# Patient Record
Sex: Female | Born: 1995 | Smoking: Never smoker
Health system: Southern US, Community
[De-identification: ages and names within clinical notes are randomized; demographics above are authoritative.]

## PROBLEM LIST (undated history)

## (undated) DIAGNOSIS — J45909 Unspecified asthma, uncomplicated: Secondary | ICD-10-CM

---

## 2015-07-10 ENCOUNTER — Inpatient Hospital Stay
Admission: EM | Admit: 2015-07-10 | Discharge: 2015-07-16 | DRG: 885 | Disposition: A | Discharge status: Home / Self Care | Payer: No Typology Code available for payment source | Source: Other Acute Inpatient Hospital | Attending: Psychiatry | Admitting: Psychiatry

## 2015-07-10 DIAGNOSIS — G4700 Insomnia, unspecified: Secondary | ICD-10-CM | POA: Diagnosis present

## 2015-07-10 DIAGNOSIS — Z9119 Patient's noncompliance with other medical treatment and regimen: Secondary | ICD-10-CM

## 2015-07-10 DIAGNOSIS — F209 Schizophrenia, unspecified: Principal | ICD-10-CM | POA: Diagnosis present

## 2015-07-10 DIAGNOSIS — F29 Unspecified psychosis not due to a substance or known physiological condition: Secondary | ICD-10-CM

## 2015-07-10 DIAGNOSIS — R454 Irritability and anger: Secondary | ICD-10-CM | POA: Diagnosis present

## 2015-07-10 DIAGNOSIS — Z9114 Patient's other noncompliance with medication regimen: Secondary | ICD-10-CM

## 2015-07-10 DIAGNOSIS — F22 Delusional disorders: Secondary | ICD-10-CM | POA: Diagnosis present

## 2015-07-10 DIAGNOSIS — J45909 Unspecified asthma, uncomplicated: Secondary | ICD-10-CM | POA: Diagnosis present

## 2015-07-10 HISTORY — DX: Unspecified asthma, uncomplicated: J45.909

## 2015-07-10 MED ORDER — ACETAMINOPHEN 325 MG PO TABS
650.0000 mg | ORAL_TABLET | Freq: Four times a day (QID) | ORAL | Status: DC | PRN
  Administered 2015-07-15 – 2015-07-16 (×3): 650 mg via ORAL
  Filled 2015-07-10 (×3): qty 2

## 2015-07-10 MED ORDER — MAGNESIUM HYDROXIDE 400 MG/5ML PO SUSP: 30.0000 mL | Freq: Every day | ORAL | Status: DC | PRN

## 2015-07-10 MED ORDER — OLANZAPINE 10 MG PO TABS
10.0000 mg | ORAL_TABLET | Freq: Once | ORAL | Status: AC
  Administered 2015-07-10: 10 mg via ORAL
  Filled 2015-07-10: qty 1

## 2015-07-10 MED ORDER — ALUM & MAG HYDROXIDE-SIMETH 200-200-20 MG/5ML PO SUSP: 30.0000 mL | ORAL | Status: DC | PRN

## 2015-07-10 NOTE — Progress Notes (Signed)
Patient with bright affect and cooperative behavior with skin check. Denies SI/HI at this time. Skin check with no wounds or bruises noted. Skin check with no contraband found. Patient oriented to room and provided with toiletries to shower. Safety maintained.
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
## 2015-07-11 ENCOUNTER — Encounter: Payer: Self-pay | Admitting: Psychiatry

## 2015-07-11 DIAGNOSIS — F209 Schizophrenia, unspecified: Principal | ICD-10-CM

## 2015-07-11 LAB — URINALYSIS COMPLETE WITH MICROSCOPIC (ARMC ONLY)
Bilirubin Urine: NEGATIVE
Glucose, UA: NEGATIVE mg/dL
Hgb urine dipstick: NEGATIVE
Ketones, ur: NEGATIVE mg/dL
Leukocytes, UA: NEGATIVE
Nitrite: NEGATIVE
PH: 6 (ref 5.0–8.0)
PROTEIN: NEGATIVE mg/dL
Specific Gravity, Urine: 1.004 — ABNORMAL LOW (ref 1.005–1.030)
WBC UA: NONE SEEN WBC/hpf (ref 0–5)

## 2015-07-11 MED ORDER — FAMOTIDINE 20 MG PO TABS
20.0000 mg | ORAL_TABLET | Freq: Every day | ORAL | Status: DC
  Administered 2015-07-11 – 2015-07-16 (×4): 20 mg via ORAL
  Filled 2015-07-11 (×7): qty 1

## 2015-07-11 MED ORDER — LORAZEPAM 2 MG PO TABS: 2.0000 mg | ORAL_TABLET | Freq: Three times a day (TID) | ORAL | Status: DC | PRN

## 2015-07-11 MED ORDER — TRAZODONE HCL 100 MG PO TABS: 100.0000 mg | ORAL_TABLET | Freq: Every evening | ORAL | Status: DC | PRN

## 2015-07-11 MED ORDER — WHITE PETROLATUM GEL
Status: DC | PRN
  Administered 2015-07-15: 13:00:00 via TOPICAL
  Filled 2015-07-11 (×2): qty 5

## 2015-07-11 MED ORDER — OLANZAPINE 5 MG PO TBDP
15.0000 mg | ORAL_TABLET | Freq: Every day | ORAL | Status: DC
  Administered 2015-07-11 – 2015-07-15 (×3): 15 mg via ORAL
  Filled 2015-07-11 (×6): qty 3

## 2015-07-11 NOTE — [facility] Group Notes (Signed)
[facility] LCSW Group Therapy  07/11/2015 1:01 PM  Type of Therapy:  Group Therapy  Participation Level:  Did Not Attend  Modes of Intervention:  Discussion, Education, Socialization and Support  Summary of Progress/Problems: Balance in life: Patients will discuss the concept of balance and how it looks and feels to be unbalanced. Pt will identify areas in their life that is unbalanced and ways to become more balanced.    Katherine Daugherty L Marcele Kosta MSW, LCSWA  07/11/2015, 1:01 PM   

## 2015-07-11 NOTE — Plan of Care (Signed)
Problem: Ineffective individual coping Goal: LTG: Patient will report a decrease in negative feelings Outcome: Not Progressing No group attendance limited insight

## 2015-07-11 NOTE — Progress Notes (Signed)
Patient ID: Katherine Daugherty, female   DOB: 05-15-1996, 20 y.o.   MRN: [id number]   Admission Note:  D:19 yr female who presents IVC in no acute distress for the treatment of psychosis . Pt appears disconnected, laughs inappropriately . Pt was calm and cooperative with admission process. Pt denies SI/HI/AVH at this time. Pt appears to be responding ,   A:Skin was assessed by prior shift. Admission finished . 15 min checks for safety continues  R: Pt had no additional questions or concerns.

## 2015-07-11 NOTE — [facility] Suicide Risk Assessment (Signed)
[facility] Admission Suicide Risk Assessment   Nursing information obtained from:    Demographic factors:    Current Mental Status:    Loss Factors:    Historical Factors:    Risk Reduction Factors:     Total Time spent with patient: 1 hour Principal Problem: Schizophrenia (HCC) Diagnosis:   Patient Active Problem List   Diagnosis Date Noted  . Schizophrenia (HCC) [F20.9] 07/10/2015   Subjective Data:   Continued Clinical Symptoms:  Alcohol Use Disorder Identification Test Final Score (AUDIT): 0 The "Alcohol Use Disorders Identification Test", Guidelines for Use in Primary Care, Second Edition.  World Science writer [facility]). Score between 0-7:  no or low risk or alcohol related problems. Score between 8-15:  moderate risk of alcohol related problems. Score between 16-19:  high risk of alcohol related problems. Score 20 or above:  warrants further diagnostic evaluation for alcohol dependence and treatment.   CLINICAL FACTORS:   Schizophrenia:   Less than 55 years old Currently Psychotic Previous Psychiatric Diagnoses and Treatments   Psychiatric Specialty Exam: ROS   COGNITIVE FEATURES THAT CONTRIBUTE TO RISK:  None    SUICIDE RISK:   Mild:  Suicidal ideation of limited frequency, intensity, duration, and specificity.  There are no identifiable plans, no associated intent, mild dysphoria and related symptoms, good self-control (both objective and subjective assessment), few other risk factors, and identifiable protective factors, including available and accessible social support.  PLAN OF CARE: admit to [facility]  I certify that inpatient services furnished can reasonably be expected to improve the patient's condition.   Jimmy Footman, MD 07/11/2015, 2:46 PM

## 2015-07-11 NOTE — H&P (Addendum)
Psychiatric Admission Assessment Adult  Patient Identification: Katherine Daugherty MRN:  [id number] Date of Evaluation:  07/11/2015 Chief Complaint:  Schizophrenia Principal Diagnosis: Schizophrenia (HCC) Diagnosis:   Patient Active Problem List   Diagnosis Date Noted  . Schizophrenia (HCC) [F20.9] 07/10/2015   History of Present Illness: Katherine Daugherty is an 20 y.o. African American female who presents to [facility] on referral from [employer] at [place]. Patient's mother, [name] [phone]), petitioned for involuntary commitment due to patient's increasingly erratic behavior. Per mother's report, patient ran out of her prescription of Zyprexa approximately five days earlier and has begun exhibiting the following symptoms: sleeplessness, responding to internal stimuli (talking to herself, laughing continuously, talking to the wall), threatening to kill siblings, and threatening to kill herself. Per report, patient has a diagnosis of Schizophreniform Disorder, PTSD, and Cannabis Use Disorder (Mild).  Patient lives with her mother and sibling in [place], [place]. 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 Per mother's report, patient began receiving psychiatric services through [facility] about a month ago. Per report, [facility] prescribed Zyprexa (5 mg am, 10 mg pm). Mother stated that immediately prior to commitment, patient refused to attend a scheduled appointment with [facility]. Mother contacted [facility] and was advised that patient should have had enough medication if taken as prescribed.  Patient is an unreliable historian. She was only partially cooperative during assessment.  Patient initially denied being diagnosed with schizophrenia and denied being on any medications prior to admission. 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 She says she just came to [place] a few days ago to visit her mother.  Later on the patient stated that her mother put her in the  hospital because she has not been taking medications. The patient says that Dr. at [facility] is started her on Risperdal at this medication did not work she was then transitioned to Zyprexa.  Patient says she is unaware of the reasons why she was prescribed these medications. Today she denies problems with her mood, appetite energy or concentration. She denies suicidality, homicidality or having auditory or visual hallucinations. She denies having any persecutory delusions.  Social worker contacted the patient's mother for collateral information. The patient's mother reported patient is not enrolled in college at this time. Patient was diagnosed with schizophrenia couple months ago and has not been compliant with medications. Patient lives with her mother.  Substance abuse history: Patient denies the use of illicit substances or alcohol. She denies the use of nicotine   Associated Signs/Symptoms: Depression Symptoms:  denies (Hypo) Manic Symptoms:  denies Anxiety Symptoms:  denies Psychotic Symptoms:  denies PTSD Symptoms: NA   Total Time spent with patient: 1 hour  Past Psychiatric History: At this time I don't have much of her past psychiatric history. Looks for the patient was diagnosed recently with schizophrenia and has been follow-up at [facility] however she has become noncompliant lately.  Past Medical History: Denies any history of chronic medical conditions. Seizures or head trauma Past Medical History  Diagnosis Date  . Asthma    History reviewed. No pertinent past surgical history.  Family History: History reviewed. No pertinent family history.  Family Psychiatric  History: Patient denies any family history of mental illness, suicide or substance abuse  Social History: Not much of her social history is known at this time. She lives with her mother. Patient says she is uninsured. History  Alcohol Use: Not on file     History  Drug Use No  Allergies:  Not on File   Lab  Results: No results found for this or any previous visit (from the past 48 hour(s)).  Metabolic Disorder Labs:  No results found for: HGBA1C, MPG No results found for: PROLACTIN No results found for: CHOL, TRIG, HDL, CHOLHDL, VLDL, LDLCALC  Current Medications: Current Facility-Administered Medications  Medication Dose Route Frequency Provider Last Rate Last Dose  . acetaminophen (TYLENOL) tablet 650 mg  650 mg Oral Q6H PRN Audery Amel, MD      . alum & mag hydroxide-simeth (MAALOX/MYLANTA) 200-200-20 MG/5ML suspension 30 mL  30 mL Oral Q4H PRN Audery Amel, MD      . magnesium hydroxide (MILK OF MAGNESIA) suspension 30 mL  30 mL Oral Daily PRN Audery Amel, MD       PTA Medications: No prescriptions prior to admission    Musculoskeletal: Strength & Muscle Tone: within normal limits Gait & Station: normal Patient leans: N/A  Psychiatric Specialty Exam: Physical Exam  Constitutional: She is oriented to person, place, and time. She appears well-developed and well-nourished.  HENT:  Head: Normocephalic and atraumatic.  Eyes: Conjunctivae and EOM are normal.  Neck: Normal range of motion.  Respiratory: Effort normal.  Musculoskeletal: Normal range of motion.  Neurological: She is alert and oriented to person, place, and time.    Review of Systems  Constitutional: Negative.   HENT: Negative.   Eyes: Negative.   Respiratory: Negative.   Cardiovascular: Negative.   Gastrointestinal: Negative.   Genitourinary: Negative.   Musculoskeletal: Negative.   Skin: Negative.   Neurological: Negative.   Endo/Heme/Allergies: Negative.   Psychiatric/Behavioral: Negative.     Blood pressure 130/84, pulse 93, temperature 98.1 F (36.7 C), temperature source Oral, resp. rate 18, height 5' 9.5" (1.765 m), weight 82.555 kg (182 lb), last menstrual period 06/10/2015.Body mass index is 26.5 kg/(m^2).  General Appearance: Fairly Groomed  Patent attorney::  Good  Speech:  Clear and Coherent   Volume:  Normal  Mood:  Dysphoric  Affect:  Constricted  Thought Process:  Linear  Orientation:  Full (Time, Place, and Person)  Thought Content:  Negative for auditory or visual hallucinations but positive for delusions  Suicidal Thoughts:  No  Homicidal Thoughts:  No  Memory:  Immediate;   Good Recent;   Good Remote;   Good  Judgement:  Impaired  Insight:  Lacking  Psychomotor Activity:  Decreased  Concentration:  Good  Recall:  Fair  Fund of Knowledge:Good  Language: Good  Akathisia:  No  Handed:    AIMS (if indicated):     Assets:  Manufacturing systems engineer Physical Health  ADL's:  Intact  Cognition: WNL  Sleep:  Number of Hours: 5.45    Treatment Plan Summary: Daily contact with patient to assess and evaluate symptoms and progress in treatment and Medication management   20 year old African-American female with diagnosis of schizophrenia. Patient has been admitted due to bizarre behavior secondary to noncompliance with antipsychotics.  Schizophrenia: Patient will be restarted on olanzapine 15 mg by mouth daily.  They do not plan will be to discharge this patient on a injectable medication due to her lack of insight and poor compliance.  Insomnia and she'll be started on trazodone 100 mg by mouth by mouth daily at bedtime when necessary  Agitation: I will order Ativan 2 mg by mouth every 8 hours as needed when necessary for agitation  Diet regular  Precocious every 15 minute checks  Hospitalization status continue involuntary commitment  Collateral  information will be obtained from the patient's mother  Labs: We'll order hemoglobin A1c, lipid panel, TSH and prolactin.  I certify that inpatient services furnished can reasonably be expected to improve the patient's condition.    Jimmy Footman, MD 2/2/20172:01 PM

## 2015-07-11 NOTE — Progress Notes (Signed)
D: Patient remains isolated to room  D: Patient stated slept good last night .Stated appetite is good and energy level  Is normal. Stated concentration is good . Stated on Depression scale , hopeless and anxiety .( low 0-10 high) Denies suicidal  homicidal ideations  .  No auditory hallucinations  No pain concerns . Appropriate ADL'S. Interacting with peers and staff.  A: Encourage patient participation with unit programming . Instruction  Given on  Medication , verbalize understanding. R: Voice no other concerns. Staff continue to monitor

## 2015-07-11 NOTE — Progress Notes (Signed)
Recreation Therapy Notes  Date: 02.02.17 Time: 3:00 pm Location: Craft Room  Group Topic: Leisure Education  Goal Area(s) Addresses:  Patient will identify things they are grateful for. Patient will identify how being grateful can influence decision making.  Behavioral Response: Did not attend  Intervention: Grateful Wheel  Activity: Patients were given an "I Am Grateful For" worksheet and instructed to list 2-3 things they are grateful for under each category.  Education: LRT educated patients on ways they can put leisure back into their schedule.  Education Outcome: Patient did not attend group.  Clinical Observations/Feedback: Patient did not attend group.  Jacquelynn Cree, LRT/CTRS 07/11/2015 3:45 PM

## 2015-07-11 NOTE — Tx Team (Signed)
Initial Interdisciplinary Treatment Plan   PATIENT STRESSORS: Medication change or noncompliance   PATIENT STRENGTHS: General fund of knowledge Supportive family/friends   PROBLEM LIST: Problem List/Patient Goals Date to be addressed Date deferred Reason deferred Estimated date of resolution  Psychosis      "ha ha ha, nothing"                                                 DISCHARGE CRITERIA:  Improved stabilization in mood, thinking, and/or behavior Verbal commitment to aftercare and medication compliance  PRELIMINARY DISCHARGE PLAN: Attend aftercare/continuing care group Outpatient therapy  PATIENT/FAMIILY INVOLVEMENT: This treatment plan has been presented to and reviewed with the patient, Katherine Daugherty.  The patient and family have been given the opportunity to ask questions and make suggestions.  Jacques Navy A 07/11/2015, 2:04 AM

## 2015-07-11 NOTE — Progress Notes (Signed)
Recreation Therapy Notes  INPATIENT RECREATION THERAPY ASSESSMENT  Patient Details Name: Katherine Daugherty MRN: [id number] DOB: [birth year] Today's Date: 07/11/2015  Patient Stressors:  Patient reported no stressors.  Coping Skills:   Isolate, Exercise, Art/Dance, Music, Sports, Other (Comment) (Sleep)  Personal Challenges:  Patient reported no personal challenges.  Leisure Interests (2+):  Music - Listen, Individual - Other (Comment) (Read)  Awareness of Community Resources:  Yes  Community Resources:  Library, Tree surgeon  Current Use: Yes  If no, Barriers?:    Patient Strengths:  Hair, skin  Patient Identified Areas of Improvement:  No  Current Recreation Participation:  NOthing but school work  Patient Goal for Hospitalization:  To hurry up and get out  [place] of Residence:  [place] of Residence:  [place]   Current [place] (including self-harm):  No  Current HI:  No  Consent to Intern Participation: N/A  Due to patient reporting no personal challenges, LRT will not develop a Recreational Therapy Care Plan at this time. If patient's status changes, LRT will develop a Recreational Therapy Care Plan.  Jacquelynn Cree, LRT/CTRS 07/11/2015, 4:47 PM

## 2015-07-11 NOTE — [facility] Group Notes (Signed)
[facility] Group Notes:  (Nursing/MHT/Case Management/Adjunct)  Date:  07/11/2015  Time:  12:52 AM  Type of Therapy:  Group Therapy  Participation Level:  Did Not Attend   Summary of Progress/Problems:  Veva Holes 07/11/2015, 12:52 AM

## 2015-07-12 ENCOUNTER — Inpatient Hospital Stay: Payer: No Typology Code available for payment source

## 2015-07-12 NOTE — [facility] Group Notes (Signed)
[facility] LCSW Group Therapy  07/12/2015 2:47 PM  Type of Therapy:  Group Therapy  Participation Level:  Did Not Attend  Modes of Intervention:  Discussion, Education, Socialization and Support  Summary of Progress/Problems: Feelings around Relapse. Group members discussed the meaning of relapse and shared personal stories of relapse, how it affected them and others, and how they perceived themselves during this time. Group members were encouraged to identify triggers, warning signs and coping skills used when facing the possibility of relapse. Social supports were discussed and explored in detail.   Katherine Daugherty L Rogue Pautler  MSW, LCSWA   07/12/2015, 2:47 PM  

## 2015-07-12 NOTE — Plan of Care (Signed)
Problem: Alteration in thought process Goal: LTG-Patient has not harmed self or others in at least 2 days Outcome: Progressing Denies suicidal ideations      

## 2015-07-12 NOTE — [facility] Group Notes (Signed)
[facility] LCSW Aftercare Discharge Planning Group Note   07/12/2015 11:06 AM  Patient did not attend.  Jenel Lucks, CSW Intern 07/12/15

## 2015-07-12 NOTE — [facility] Group Notes (Signed)
[facility] Group Notes:  (Nursing/MHT/Case Management/Adjunct)  Date:  07/12/2015  Time:  2:49 PM  Type of Therapy:  Group Therapy  Participation Level:  Did Not Attend  Katherine Daugherty 07/12/2015, 2:49 PM 

## 2015-07-12 NOTE — Tx Team (Signed)
Interdisciplinary Treatment Plan Update (Adult)  Date:  07/12/2015 Time Reviewed:  5:02 PM  Progress in Treatment: Attending groups: No. Participating in groups:  No. Taking medication as prescribed:  Yes. Tolerating medication:  Yes. Family/Significant othe contact made:  Yes, individual(s) contacted:  mother Patient understands diagnosis:  No. and As evidenced by:  limited insight  Discussing patient identified problems/goals with staff:  Yes. Medical problems stabilized or resolved:  Yes. Denies suicidal/homicidal ideation: Yes. Issues/concerns per patient self-inventory:  Yes. Other:  New problem(s) identified: No, Describe:  NA  Discharge Plan or Barriers: Pt plans to return home and follow up with outpatient.    Reason for Continuation of Hospitalization: Delusions  Hallucinations Medication stabilization  Comments: Katherine Daugherty is an 20 y.o. African American female who presents to [place] [facility] on referral from [facility] at [place]. Patient's mother, Katherine Daugherty [id number]), petitioned for involuntary commitment due to patient's increasingly erratic behavior. Per mother's report, patient ran out of her prescription of Zyprexa approximately five days earlier and has begun exhibiting the following symptoms: sleeplessness, responding to internal stimuli (talking to herself, laughing continuously, talking to the wall), threatening to kill siblings, and threatening to kill herself. Per report, patient has a diagnosis of Schizophreniform Disorder, PTSD, and Cannabis Use Disorder (Mild).  Patient lives with her mother and sibling in [place]. 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 Per mother's report, patient began receiving psychiatric services through [facility] about a month ago. Per report, [facility] prescribed Zyprexa (5 mg am, 10 mg pm). Mother stated that immediately prior to commitment, patient refused to attend a scheduled appointment with  [facility]. Mother contacted [facility] and was advised that patient should have had enough medication if taken as prescribed.   Estimated length of stay:5-7 days   New goal(s): NA  Review of initial/current patient goals per problem list:   1.  Goal(s): Patient will participate in aftercare plan * Met:  * Target date: at discharge * As evidenced by: Patient will participate within aftercare plan AEB aftercare provider and housing plan at discharge being identified.  2.  Goal (s): Patient will demonstrate decreased symptoms of psychosis. * Met: No  *  Target date: at discharge * As evidenced by: Patient will not endorse signs of psychosis or be deemed stable for discharge by MD.   Attendees: Patient:  Katherine Daugherty 2/3/20175:02 PM  Family:   2/3/20175:02 PM  Physician:  Dr. Jerilee Hoh   2/3/20175:02 PM  Nursing:   Seth Bake, RN  2/3/20175:02 PM  Case Manager:   2/3/20175:02 PM  Counselor:   2/3/20175:02 PM  Other:  Campbell Stall Karle Desrosier,LCSWA 2/3/20175:02 PM  Other:  Everitt Amber, [place]  2/3/20175:02 PM  Other:   2/3/20175:02 PM  Other:  2/3/20175:02 PM  Other:  2/3/20175:02 PM  Other:  2/3/20175:02 PM  Other:  2/3/20175:02 PM  Other:  2/3/20175:02 PM  Other:  2/3/20175:02 PM  Other:   2/3/20175:02 PM   Scribe for Treatment Team:   Wray Kearns, MSW, LCSWA  07/12/2015, 5:02 PM

## 2015-07-12 NOTE — Progress Notes (Addendum)
Patient denies depression & suicidal ideations.Visible in the milieu,minimal interactions with peers.Compliant with medications.Denies AV hallucinations.Attended groups.Appetite & energy level normal.Patient refused to get urine for lab.

## 2015-07-12 NOTE — Plan of Care (Signed)
Problem: Alteration in thought process Goal: LTG-Patient verbalizes understanding importance med regimen (Patient verbalizes understanding of importance of medication regimen and need to continue outpatient care.)  Outcome: Progressing Pt complies with medication regimen

## 2015-07-12 NOTE — Progress Notes (Signed)
D: Observed pt in day room interacting with family during visiting hour. Patient alert and oriented x4. Patient denies SI/HI/AVH. Pt affect is anxious and irritable. Pt preoccupied with discharge stating "I don't think I should be here.Marland KitchenMarland KitchenI don't like being around people. " Pt forwards little and has no insight into hospitalization indicating she came here "because I ran out of meds,"  A: Offered active listening and support. Provided therapeutic communication. Administered scheduled medications.  R: Pt cooperative. Pt medication compliant. Will continue Q15 min. checks. Safety maintained.

## 2015-07-12 NOTE — Progress Notes (Signed)
Recreation Therapy Notes  Date: 02.03.17 Time: 3:00 pm Location: Craft Room  Group Topic: Communication, Problem Solving, Teamwork  Goal Area(s) Addresses:  Patient will work in teams towards shared goal. Patient will verbalize skills needed to make activities successful. Patient will verbalize benefit of using skills identify to reach post d/c goals.  Behavioral Response: Attentive, Interactive  Intervention: Landing Pad  Activity: Patients were given 15 straws and about 2.5-3 feet of tape and instructed to build a contraption to catch a golf ball dropped from about 4 feet.  Education: LRT educated patients on why these skills are important.  Education Outcome: Acknowledges education/In group clarification offered  Clinical Observations/Feedback: Patient did not work with peers to build contraption. Patient contributed to group discussion. Patient started laughing. LRT redirected patient. Patient complied.  Jacquelynn Cree, LRT/CTRS 07/12/2015 4:45 PM

## 2015-07-12 NOTE — Progress Notes (Addendum)
[facility] MD Progress Note  07/12/2015 10:10 AM Katherine Daugherty  MRN:  [id number] Subjective:  Patient continues to be vague and evasive basically answering questions with "yes" or "no".  Unreliable historian. We will unable to contact mother yesterday. Will attend again today to obtain collateral information. Patient denies depressed mood, problems with sleep, appetite energy or concentration. She denies auditory or visual hallucinations. She denies side effects from medications. She denies any physical complaints. 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 Patient comply with medications last night. Per nursing she is sleeping about 6 hours and need two meals.  Per nursing: D: Observed pt in day room interacting with family during visiting hour. Patient alert and oriented x4. Patient denies SI/HI/AVH. Pt affect is anxious and irritable. Pt preoccupied with discharge stating "I don't think I should be here.Marland KitchenMarland KitchenI don't like being around people. " Pt forwards little and has no insight into hospitalization indicating she came here "because I ran out of meds,"  A: Offered active listening and support. Provided therapeutic communication. Administered scheduled medications.  R: Pt cooperative. Pt medication compliant. Will continue Q15 min. checks. Safety maintained.  Principal Problem: Schizophrenia (HCC) Diagnosis:   Patient Active Problem List   Diagnosis Date Noted  . Schizophrenia (HCC) [F20.9] 07/10/2015   Total Time spent with patient: 30 minutes   Sleep: Good  Appetite:  Good   Past Psychiatric History: At this time I don't have much of her past psychiatric history. Looks for the patient was diagnosed recently with schizophrenia and has been follow-up at [facility] however she has become noncompliant lately.  Past Medical History: Denies any history of chronic medical conditions. Seizures or head trauma Past Medical History  Diagnosis Date  . Asthma    History reviewed. No pertinent past surgical history.  Family History: History reviewed. No pertinent family history.  Family Psychiatric History: Patient denies any family history of mental illness, suicide or substance abuse  Social History: Not much of her social history is known at this time. She lives with her mother. Patient says she is uninsured. History  Alcohol Use: Not on file    History  Drug Use No     Allergies: Not on File        Current Medications: Current Facility-Administered Medications  Medication Dose Route Frequency Provider Last Rate Last Dose  . acetaminophen (TYLENOL) tablet 650 mg  650 mg Oral Q6H PRN Audery Amel, MD      . alum & mag hydroxide-simeth (MAALOX/MYLANTA) 200-200-20 MG/5ML suspension 30 mL  30 mL Oral Q4H PRN Audery Amel, MD      . famotidine (PEPCID) tablet 20 mg  20 mg Oral Daily Jimmy Footman, MD   20 mg at 07/11/15 1616  . LORazepam (ATIVAN) tablet 2 mg  2 mg Oral TID PRN Jimmy Footman, MD      . magnesium hydroxide (MILK OF MAGNESIA) suspension 30 mL  30 mL Oral Daily PRN Audery Amel, MD      . OLANZapine zydis (ZYPREXA) disintegrating tablet 15 mg  15 mg Oral QHS Jimmy Footman, MD   15 mg at 07/11/15 2155  . traZODone (DESYREL) tablet 100 mg  100 mg Oral QHS PRN Jimmy Footman, MD      . white petrolatum (VASELINE) gel   Topical PRN Audery Amel, MD        Lab Results:  Results for orders placed or performed during the hospital encounter of 07/10/15 (  from the past 48 hour(s))  Urinalysis complete, with microscopic ([facility] only)     Status: Abnormal   Collection Time: 07/11/15  6:22 PM  Result Value Ref Range   Color, Urine STRAW (A) YELLOW   APPearance CLEAR (A) CLEAR   Glucose, UA NEGATIVE NEGATIVE mg/dL   Bilirubin Urine NEGATIVE NEGATIVE   Ketones, ur NEGATIVE NEGATIVE mg/dL   Specific Gravity, Urine 1.004 (L) 1.005 - 1.030   Hgb urine dipstick NEGATIVE NEGATIVE    pH 6.0 5.0 - 8.0   Protein, ur NEGATIVE NEGATIVE mg/dL   Nitrite NEGATIVE NEGATIVE   Leukocytes, UA NEGATIVE NEGATIVE   RBC / HPF 0-5 0 - 5 RBC/hpf   WBC, UA NONE SEEN 0 - 5 WBC/hpf   Bacteria, UA RARE (A) NONE SEEN   Squamous Epithelial / LPF 0-5 (A) NONE SEEN    Physical Findings: AIMS: Facial and Oral Movements Muscles of Facial Expression: None, normal Lips and Perioral Area: None, normal Jaw: None, normal Tongue: None, normal,Extremity Movements Upper (arms, wrists, hands, fingers): None, normal Lower (legs, knees, ankles, toes): None, normal, Trunk Movements Neck, shoulders, hips: None, normal, Overall Severity Severity of abnormal movements (highest score from questions above): None, normal Incapacitation due to abnormal movements: None, normal Patient's awareness of abnormal movements (rate only patient's report): No Awareness, Dental Status Current problems with teeth and/or dentures?: No Does patient usually wear dentures?: No  CIWA:    COWS:     Musculoskeletal: Strength & Muscle Tone: within normal limits Gait & Station: normal Patient leans: N/A  Psychiatric Specialty Exam: Review of Systems  Constitutional: Negative.   HENT: Negative.   Eyes: Negative.   Respiratory: Negative.   Cardiovascular: Negative.   Gastrointestinal: Negative.   Genitourinary: Negative.   Musculoskeletal: Negative.   Skin: Negative.   Neurological: Negative.   Endo/Heme/Allergies: Negative.   Psychiatric/Behavioral: Negative.     Blood pressure 113/63, pulse 82, temperature 98.1 F (36.7 C), temperature source Oral, resp. rate 18, height 5' 9.5" (1.765 m), weight 82.555 kg (182 lb), last menstrual period 06/10/2015.Body mass index is 26.5 kg/(m^2).  General Appearance: Fairly Groomed  Patent attorney::  Minimal  Speech:  Clear and Coherent  Volume:  Normal  Mood:  Euthymic  Affect:  Constricted  Thought Process:  vague  Orientation:  Full (Time, Place, and Person)  Thought  Content:  Delusions  Suicidal Thoughts:  No  Homicidal Thoughts:  No  Memory:  Immediate;   Fair Recent;   Fair Remote;   Fair  Judgement:  Impaired  Insight:  Lacking  Psychomotor Activity:  Decreased  Concentration:  Fair  Recall:  [employer] of Knowledge:Fair  Language: Fair  Akathisia:  No  Handed:    AIMS (if indicated):     Assets:  Games developer Social Support  ADL's:  Intact  Cognition: WNL  Sleep:  Number of Hours: 6.5   Treatment Plan Summary: Daily contact with patient to assess and evaluate symptoms and progress in treatment and Medication management  20 year old African-American female with diagnosis of schizophrenia. Patient has been admitted due to bizarre behavior secondary to noncompliance with antipsychotics.  Schizophrenia: Continue olanzapine 15 mg by mouth daily. Patient is states that she was taking olanzapine 15 mg prior to admission.  She had stopped taking them Zyprexa because she ran out.  Pan will be to discharge this patient on a injectable medication due to her lack of insight and poor compliance.  Insomnia: Continue trazodone 100 mg  by mouth daily at bedtime when necessary  Agitation: Continue Ativan 2 mg by mouth every 8 hours as needed when necessary for agitation  Diet regular  Precautions every 15 minute checks  Hospitalization status continue involuntary commitment  Collateral information will be obtained from the patient's mother  Labs: We'll order hemoglobin A1c, lipid panel, TSH and prolactin.--- These labs are still pending  The information was obtained from the patient's mother today. She reports that the patient was at [facility] in November for 3 weeks then she was restarted on olanzapine. She was then transferred to [facility] for one week. She was treated there with Risperdal 1 mg by mouth twice a day and however this medication was discontinued due to tremors and  she was treated again with olanzapine to 15 mg a day.  After discharge the patient became noncompliant. The patient refused to go to her follow-up appointment at [facility].  Patient has been using cannabis and only drinks occasionally on the weekends.  Mother said that prior to admission the patient was laughing inappropriately and throwing herself on the ground, she was arguing on the phone with videos.  Mother states that the patient has threatened suicide 3 times before and has attempted to strangle her.    Patient graduated high school.  Currently lives with her mother in [place]. Patient's father lives in [place].  Patient has not been in [place] in 7 years.  As far as family history patient has a great aunt who had suffered from schizophrenia.   Patient has been refusing blood work. We'll request again to have blood drawn today.  Jimmy Footman, MD 07/12/2015, 10:10 AM

## 2015-07-13 LAB — URINE DRUG SCREEN, QUALITATIVE (ARMC ONLY)
AMPHETAMINES, UR SCREEN: NOT DETECTED
BARBITURATES, UR SCREEN: NOT DETECTED
Benzodiazepine, Ur Scrn: NOT DETECTED
COCAINE METABOLITE, UR ~~LOC~~: NOT DETECTED
Cannabinoid 50 Ng, Ur ~~LOC~~: NOT DETECTED
MDMA (ECSTASY) UR SCREEN: NOT DETECTED
Methadone Scn, Ur: NOT DETECTED
OPIATE, UR SCREEN: NOT DETECTED
Phencyclidine (PCP) Ur S: NOT DETECTED
TRICYCLIC, UR SCREEN: NOT DETECTED

## 2015-07-13 LAB — PREGNANCY, URINE: Preg Test, Ur: NEGATIVE

## 2015-07-13 NOTE — [facility] Group Notes (Signed)
[facility] LCSW Group Therapy  07/13/2015 3:25 PM  Type of Therapy:  Group Therapy  Participation Level:  Did Not Attend  Modes of Intervention:  Discussion, Education, Socialization and Support  Summary of Progress/Problems: Boundaries: Patients defined boundaries and discussed the importance of them. Patients identified their own boundaries and how they feel when they are crossed. Patients discussed ways to create and/ or improve their personal boundaries.   Katherine Daugherty L Jarod Bozzo MSW, LCSWA  07/13/2015, 3:25 PM  
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
## 2015-07-13 NOTE — Plan of Care (Signed)
Problem: Alteration in thought process Goal: LTG-Patient verbalizes understanding importance med regimen (Patient verbalizes understanding of importance of medication regimen and need to continue outpatient care.)  Outcome: Progressing Pt complying with medication regimen.

## 2015-07-13 NOTE — [facility] Counselor (Signed)
Adult Comprehensive Assessment  Patient ID: Katherine Daugherty, female   DOB: 09-17-1995, 20 y.o.   MRN: [id number]  Information Source: Information source: Patient  Current Stressors:  Educational / Learning stressors: None reported  Employment / Job issues: Unemployed  Family Relationships: None reported  Surveyor, quantity / Lack of resources (include bankruptcy): Financial support from mother.  Housing / Lack of housing: Pt lives with mother.  Physical health (include injuries & life threatening diseases): None reported  Social relationships: None reported  Substance abuse: Denies use  Bereavement / Loss: Denies reported   Living/Environment/Situation:  Living Arrangements: Parent Living conditions (as described by patient or guardian): Pt lives with mother  How long has patient lived in current situation?: "a little while"  What is atmosphere in current home: Comfortable  Family History:  Marital status: Single Are you sexually active?: Yes What is your sexual orientation?: Heterosexual  Has your sexual activity been affected by drugs, alcohol, medication, or emotional stress?: None reported  Does patient have children?: No  Childhood History:  By whom was/is the patient raised?: Mother Additional childhood history information: Father lives in [place]  Description of patient's relationship with caregiver when they were a child: "ok" relationship with mother.  Patient's description of current relationship with people who raised him/her: "ok" relationship with mother.  How were you disciplined when you got in trouble as a child/adolescent?: None reported  Does patient have siblings?: Yes Number of Siblings: 6 Description of patient's current relationship with siblings: distant relationship  Did patient suffer any verbal/emotional/physical/sexual abuse as a child?: No Did patient suffer from severe childhood neglect?: No Has patient ever been sexually abused/assaulted/raped as an  adolescent or adult?: No Was the patient ever a victim of a crime or a disaster?: No Has patient been effected by domestic violence as an adult?: No  Education:  Highest grade of school patient has completed: high school  Currently a student?: No Name of school: n/a Contact person: n/a Learning disability?: No  Employment/Work Situation:   Employment situation: Unemployed Patient's job has been impacted by current illness: No What is the longest time patient has a held a job?: NA Where was the patient employed at that time?: NA Has patient ever been in the [employer]?: No  Financial Resources:   Surveyor, quantity resources: Support from parents / caregiver Does patient have a Lawyer or guardian?: No  Alcohol/Substance Abuse:   What has been your use of drugs/alcohol within the last 12 months?: Marijuana use Alcohol/Substance Abuse Treatment Hx: Denies past history Has alcohol/substance abuse ever caused legal problems?: No  Social Support System:   Conservation officer, nature Support System: Good Describe Community Support System: family  Type of faith/religion: NA How does patient's faith help to cope with current illness?: NA  Leisure/Recreation:   Leisure and Hobbies: basketball, football.  Strengths/Needs:   What things does the patient do well?: basketball and football In what areas does patient struggle / problems for patient: "nothing"   Discharge Plan:   Does patient have access to transportation?: Yes Will patient be returning to same living situation after discharge?: Yes Currently receiving community mental health services: Yes (From Whom) V[facility] Mixer in [place]) Does patient have financial barriers related to discharge medications?: Yes Patient description of barriers related to discharge medications: no income, no insurance   Summary/Recommendations:    Patient is a 19 year old female admitted to with a diagnosis of Schizophrenia. Patient presented to the  hospital with audio hallucinations,SI and HI. Patient reports primary  triggers for admission were non compliance on medication. Patient will benefit from crisis stabilization, medication evaluation, group therapy and psycho education in addition to case management for discharge. At discharge, it is recommended that patient remain compliant with established discharge plan and continued treatment.   Marqueze Ramcharan L Gillie Fleites. MSW, LCSWA  07/13/2015

## 2015-07-13 NOTE — Progress Notes (Signed)
D: Observed pt in day room interacting with peers. Patient alert and oriented x4. Patient denies SI/HI/AVH. Pt affect is anxious and irritable. Pt stated "the days been great" smiling excessively. Pt forwards little and has no insight into hospitalization. Pt laughs at inappropriate times.  A: Offered active listening and support. Provided therapeutic communication. Administered scheduled medications.  R: Pt cooperative. Pt medication compliant. Will continue Q15 min. checks. Safety maintained.

## 2015-07-13 NOTE — Progress Notes (Signed)
Pt has been pleasant and cooperative.Pt did attend any unit activities but has been mostly seclusive to her room. Pt's mood and affect has been depressed. Pt denies SI and A/V hallucinations. Pt refused her AM meds,stating, " I don't need that".

## 2015-07-14 MED ORDER — OLANZAPINE 10 MG IM SOLR: 15.0000 mg | Freq: Every evening | INTRAMUSCULAR | Status: DC | PRN

## 2015-07-14 NOTE — Plan of Care (Signed)
Problem: Ineffective individual coping Goal: STG: Patient will remain free from self harm Outcome: Progressing Patient denies any thoughts of self harm.     

## 2015-07-14 NOTE — [facility] Group Notes (Signed)
[facility] LCSW Group Therapy  07/14/2015 2:51 PM  Type of Therapy:  Group Therapy  Participation Level:  Minimal  Participation Quality:  Attentive  Affect:  Flat  Cognitive:  Alert  Insight:  Improving  Engagement in Therapy:  Improving  Modes of Intervention:  Activity, Discussion, Education, Socialization and Support  Summary of Progress/Problems:Feelings around diagnosis: Patients discussed what mental health means to them and how it has impacted their lives. Patients discussed receiving a diagnosis and how it made them feel. They were encouraged to share experiences related to mental health diagnosis and how other people have reacted. Patient attended group and stayed the entire time. She sat quietly and listened to other group members.   Katherine Daugherty L [name]  MSW, LCSWA   07/14/2015, 2:51 PM
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
## 2015-07-14 NOTE — [facility] Group Notes (Signed)
[facility] Group Notes:  (Nursing/MHT/Case Management/Adjunct)  Date:  07/14/2015  Time:  8:30 AM  Type of Therapy:  Community Meeting   Participation Level:  Minimal  Participation Quality:  Attentive  Affect:  Flat  Cognitive:  Alert  Insight:  Improving  Engagement in Group:  Limited  Modes of Intervention:  Discussion and Education  Summary of Progress/Problems:  Katherine Daugherty Katherine Daugherty 07/14/2015, 6:07 PM

## 2015-07-14 NOTE — Progress Notes (Signed)
Patient refused her Zyprexa on shift even when given encouragement to take it, she also denied being [place], she sat in the dayroom with peers most of the evening but required redirection in her interaction with others.  She appears to be in bed resting at this time.

## 2015-07-14 NOTE — Progress Notes (Signed)
[facility] MD Progress Note  07/14/2015 2:24 PM Katherine Daugherty  MRN:  [id number]   Subjective:    The patient was irritable yesterday evening and refused to take Zyprexa. She got into a verbal altercation with another patient yesterday as well but no is goal aggression. She has been noted to be laughing inappropriately at times although she denies any auditory hallucinations. She denies any visual hallucinations. Insight and judgment remain poor. She continues to also refused blood work. The patient does report some delusional beliefs about studying law in [place]. She is focused on discharge and has not been attending groups. She denies any problems with her appetite. Per nursing, she slept close to 6 hours last night. The patient admits that she has been reclusive to her room and sleeping some during the daytime as well.  Supportive psychotherapy provided and time spent discussing anger management and coping skills to help with anger.    Principal Problem: Schizophrenia (HCC) Diagnosis:   Patient Active Problem List   Diagnosis Date Noted  . Schizophrenia (HCC) [F20.9] 07/10/2015   Total Time spent with patient: 30 minutes   Sleep: Good  Appetite:  Good   Past Psychiatric History: At this time I don't have much of her past psychiatric history. Looks for the patient was diagnosed recently with schizophrenia and has been follow-up at [facility] however she has become noncompliant lately.  Past Medical History: Denies any history of chronic medical conditions. Seizures or head trauma Past Medical History  Diagnosis Date  . Asthma    History reviewed. No pertinent past surgical history.  Family History: History reviewed. No pertinent family history.  Family Psychiatric History: Patient denies any family history of mental illness, suicide or substance abuse  Social History: Not much of her social history is known at this time. She lives with her mother. Patient says she is  uninsured. History from patient is unreliable. History  Alcohol Use: Not on file    History  Drug Use No     Allergies: Not on File        Current Medications: Current Facility-Administered Medications  Medication Dose Route Frequency Provider Last Rate Last Dose  . acetaminophen (TYLENOL) tablet 650 mg  650 mg Oral Q6H PRN Audery Amel, MD      . alum & mag hydroxide-simeth (MAALOX/MYLANTA) 200-200-20 MG/5ML suspension 30 mL  30 mL Oral Q4H PRN Audery Amel, MD      . famotidine (PEPCID) tablet 20 mg  20 mg Oral Daily Jimmy Footman, MD   20 mg at 07/12/15 1038  . LORazepam (ATIVAN) tablet 2 mg  2 mg Oral TID PRN Jimmy Footman, MD      . magnesium hydroxide (MILK OF MAGNESIA) suspension 30 mL  30 mL Oral Daily PRN Audery Amel, MD      . OLANZapine (ZYPREXA) injection 15 mg  15 mg Intramuscular QHS PRN Darliss Ridgel, MD      . OLANZapine zydis (ZYPREXA) disintegrating tablet 15 mg  15 mg Oral QHS Jimmy Footman, MD   15 mg at 07/12/15 2109  . traZODone (DESYREL) tablet 100 mg  100 mg Oral QHS PRN Jimmy Footman, MD      . white petrolatum (VASELINE) gel   Topical PRN Audery Amel, MD        Lab Results:  No results found for this or any previous visit (from the past 48 hour(s)).  Physical Findings: AIMS: Facial and Oral Movements Muscles of Facial Expression: None,  normal Lips and Perioral Area: None, normal Jaw: None, normal Tongue: None, normal,Extremity Movements Upper (arms, wrists, hands, fingers): None, normal Lower (legs, knees, ankles, toes): None, normal, Trunk Movements Neck, shoulders, hips: None, normal, Overall Severity Severity of abnormal movements (highest score from questions above): None, normal Incapacitation due to abnormal movements: None, normal Patient's awareness of abnormal movements (rate only patient's report): No Awareness, Dental Status Current problems with teeth and/or dentures?:  No Does patient usually wear dentures?: No      Musculoskeletal: Strength & Muscle Tone: within normal limits Gait & Station: normal Patient leans: N/A  Psychiatric Specialty Exam: Review of Systems  Constitutional: Negative.  Negative for fever, chills, weight loss, malaise/fatigue and diaphoresis.  HENT: Negative.  Negative for congestion, ear discharge, ear pain, hearing loss, nosebleeds, sore throat and tinnitus.   Eyes: Negative.  Negative for blurred vision, double vision, photophobia and pain.  Respiratory: Negative.  Negative for cough, hemoptysis, sputum production, shortness of breath, wheezing and stridor.   Cardiovascular: Negative.  Negative for chest pain, palpitations, orthopnea, claudication, leg swelling and PND.  Gastrointestinal: Negative.  Negative for heartburn, nausea, vomiting, abdominal pain, diarrhea, constipation, blood in stool and melena.  Genitourinary: Negative.  Negative for dysuria, urgency and frequency.  Musculoskeletal: Negative for myalgias, back pain, joint pain and neck pain.  Skin: Negative.  Negative for itching and rash.  Neurological: Negative.  Negative for dizziness, tingling, tremors, sensory change, speech change, focal weakness, weakness and headaches.  Endo/Heme/Allergies: Negative.  Negative for environmental allergies. Does not bruise/bleed easily.    Blood pressure 109/66, pulse 77, temperature 97.9 F (36.6 C), temperature source Oral, resp. rate 20, height 5' 9.5" (1.765 m), weight 82.555 kg (182 lb), last menstrual period 06/10/2015.Body mass index is 26.5 kg/(m^2).  General Appearance: Fairly Groomed  Patent attorney::  Minimal  Speech:  Clear and Coherent  Volume:  Normal  Mood:  Euthymic  Affect:  At times she has been irritable  Thought Process:  vague  Orientation:  Full (Time, Place, and Person)  Thought Content:  Delusions  Suicidal Thoughts:  No  Homicidal Thoughts:  No  Memory:  Immediate;   Fair Recent;   Fair Remote;    Fair  Judgement:  Impaired  Insight:  Lacking  Psychomotor Activity:  Decreased  Concentration:  Fair  Recall:  [employer] of Knowledge:Fair  Language: Fair  Akathisia:  No  Handed:    AIMS (if indicated):     Assets:  Games developer Social Support  ADL's:  Intact  Cognition: WNL  Sleep:  Number of Hours: 7.25   Treatment Plan Summary:   20 year old African-American female with diagnosis of schizophrenia. Patient has been admitted due to bizarre behavior secondary to noncompliance with antipsychotics.  Schizophrenia: She continues to have some bizarre delusions but has been compliant with medications. She refused Zyprexa last night. We'll continue Zyprexa 15 mg by mouth nightly and will give Zyprexa 15mg  IM if she refuses po meds. Patient is states that she was taking olanzapine 15 mg prior to admission.  She had stopped taking them Zyprexa because she ran out. The patient should be on an injectable medication due to her lack of insight and poor compliance.  Insomnia: Continue trazodone 100 mg by mouth daily at bedtime when necessary  Agitation: Continue Ativan 2 mg by mouth every 8 hours as needed when necessary for agitation  Diet regular  Precautions every 15 minute checks  Hospitalization status continue involuntary commitment  Collateral information will be obtained from the patient's mother  Labs: We'll order hemoglobin A1c, lipid panel, TSH and prolactin.--- These labs are still pending  The information was obtained from the patient's mother today. She reports that the patient was at [facility] in November for 3 weeks then she was restarted on olanzapine. She was then transferred to [facility] for one week. She was treated there with Risperdal 1 mg by mouth twice a day and however this medication was discontinued due to tremors and she was treated again with olanzapine to 15 mg a day.  After discharge the  patient became noncompliant. The patient refused to go to her follow-up appointment at [facility].  Patient has been using cannabis and only drinks occasionally on the weekends.  Mother said that prior to admission the patient was laughing inappropriately and throwing herself on the ground, she was arguing on the phone with videos.  Mother states that the patient has threatened suicide 3 times before and has attempted to strangle her.    Patient graduated high school.  Currently lives with her mother in [place]. Patient's father lives in [place].  Patient has not been in [place] in 7 years.  As far as family history patient has a great aunt who had suffered from schizophrenia.  Patient has been refusing blood work. We'll request again every day  Daily contact with patient to assess and evaluate symptoms and progress in treatment and Medication management  Levora Angel, MD 07/14/2015, 2:24 PM

## 2015-07-15 MED ORDER — OLANZAPINE 15 MG PO TABS: 15.0000 mg | ORAL_TABLET | Freq: Every day | ORAL | Status: AC

## 2015-07-15 MED ORDER — TRAZODONE HCL 100 MG PO TABS: 100.0000 mg | ORAL_TABLET | Freq: Every evening | ORAL | Status: AC | PRN

## 2015-07-15 MED ORDER — FAMOTIDINE 20 MG PO TABS: 20.0000 mg | ORAL_TABLET | Freq: Every day | ORAL | Status: AC

## 2015-07-15 MED ORDER — OLANZAPINE 20 MG PO TABS: 10.0000 mg | ORAL_TABLET | Freq: Every day | ORAL | Status: DC

## 2015-07-15 NOTE — Plan of Care (Signed)
Problem: Alteration in thought process Goal: LTG-Patient behavior demonstrates decreased signs psychosis (Patient behavior demonstrates decreased signs of psychosis to the point the patient is safe to return home and continue treatment in an outpatient setting.)  Outcome: Progressing No signs of hallucinations.  No inappropriate laughter noted.  No signs of responding to any internal stimuli.

## 2015-07-15 NOTE — [facility] Group Notes (Signed)
[facility] LCSW Aftercare Discharge Planning Group Note   07/15/2015 12:33 PM  Participation Quality:  Patient attended and participated in group discussion but appeared eager to discharge. Patient shared her SMART goal is to "get back on my meds and be sure I don't run out again".  Mood/Affect:  Blunted  Depression Rating:  0/10  Anxiety Rating:  0/10  Thoughts of Suicide:  No Will you contract for safety?   NA  Current AVH:  No  Plan for Discharge/Comments:  Patient plans to discharge home once she is back on medications  Transportation Means: Patient states police may be able to provide transportation at discharge otherwise will need assistance  Lulu Riding, MSW, [employer]

## 2015-07-15 NOTE — [facility] Group Notes (Signed)
[facility] LCSW Group Therapy  07/15/2015 3:31 PM  Type of Therapy:  Group Therapy  Participation Level:  Did Not Attend  Summary of Progress/Problems:Patient was called to group but did not attend.  Dominque Marlin T, MSW, LCSWA  07/15/2015, 3:31 PM  

## 2015-07-15 NOTE — Progress Notes (Signed)
D: Pt denies SI/HI/AVH. Affect is flat and sad, minimal interaction with staff, denies pain and discomfort. Pt  is interacting with peers appropriately.  A: Pt was offered support and encouragement. Pt was offered scheduled medications. Pt was encouraged to attend groups. Q 15 minute checks were done for safety.  R:Pt attend group; super bowl and interacts well with peers and staff. Pt  Refused medication. Pt is not  receptive to treatment. Safety maintained on unit.

## 2015-07-15 NOTE — B[facility] Suicide Risk Assessment (Signed)
[facility] INPATIENT:  Family/Significant Other Suicide Prevention Education  Suicide Prevention Education:  Education Completed;[name] [phone] (mom) has been identified by the patient as the family member/significant other with whom the patient will be residing, and identified as the person(s) who will aid the patient in the event of a mental health crisis (suicidal ideations/suicide attempt).  With written consent from the patient, the family member/significant other has been provided the following suicide prevention education, prior to the and/or following the discharge of the patient.  The suicide prevention education provided includes the following:  Suicide risk factors  Suicide prevention and interventions  National Suicide Hotline telephone number  [facility] assessment telephone number  [facility] Emergency Assistance 911  [facility] and/or Residential Mobile Crisis Unit telephone number  Request made of family/significant other to:  Remove weapons (e.g., guns, rifles, knives), all items previously/currently identified as safety concern.    Remove drugs/medications (over-the-counter, prescriptions, illicit drugs), all items previously/currently identified as a safety concern.  The family member/significant other verbalizes understanding of the suicide prevention education information provided.  The family member/significant other agrees to remove the items of safety concern listed above.  [employer] MSW, LCSWA  07/15/2015, 4:17 PM

## 2015-07-15 NOTE — [facility] Group Notes (Signed)
[facility] Group Notes:  (Nursing/MHT/Case Management/Adjunct)  Date:  07/15/2015  Time:  1:12 PM  Type of Therapy:  Psychoeducational Skills  Participation Level:  Active  Participation Quality:  Appropriate  Affect:  Appropriate  Cognitive:  Appropriate  Insight:  Appropriate  Engagement in Group:  Engaged  Modes of Intervention:  Discussion and Education  Summary of Progress/Problems:  Katherine Daugherty 07/15/2015, 1:12 PM

## 2015-07-15 NOTE — Progress Notes (Signed)
[facility] MD Progress Note  07/15/2015 1:18 PM Katherine Daugherty  MRN:  [id number]   Subjective:   Patient was calm, pleasant and cooperative during assessment today. She denies having any issues or complaints. Denies plans with mood, appetite, energy or concentration. Denies suicidality, homicidality or having auditory or visual hallucinations. The patient refused olanzapine on Saturday, but that she has been compliant with olanzapine since admission. Per staff patient has been having verbal altercations with another patient. This other patient has been picking fights with peers and causing disruption in the milieu.  During the early part of her hospitalization the patient had minimal interaction with this Clinical research associate with anybody else from staff. She was withdrawn in her room. Since Friday she has been seen in the day room interacting with peers.  Patient denies side effects from olanzapine. She denies any physical complaints.  Stay she does not want to take any long-acting injectables because she dislikes needles.  Per nursing: D: Pt denies SI/HI/AVH. Affect is flat and sad, minimal interaction with staff, denies pain and discomfort. Pt is interacting with peers appropriately.  A: Pt was offered support and encouragement. Pt was offered scheduled medications. Pt was encouraged to attend groups. Q 15 minute checks were done for safety.  R:Pt attend group; super bowl and interacts well with peers and staff. Pt Refused medication. Pt is not receptive to treatment. Safety maintained on unit.  Principal Problem: Schizophrenia (HCC) Diagnosis:   Patient Active Problem List   Diagnosis Date Noted  . Schizophrenia (HCC) [F20.9] 07/10/2015   Total Time spent with patient: 30 minutes   Sleep: Good  Appetite:  Good   Past Psychiatric History: At this time I don't have much of her past psychiatric history. Looks for the patient was diagnosed recently with schizophrenia and has been follow-up at [facility]  however she has become noncompliant lately.  Past Medical History: Denies any history of chronic medical conditions. Seizures or head trauma Past Medical History  Diagnosis Date  . Asthma    History reviewed. No pertinent past surgical history.  Family History: History reviewed. No pertinent family history.  Family Psychiatric History: Patient denies any family history of mental illness, suicide or substance abuse  Social History: Not much of her social history is known at this time. She lives with her mother. Patient says she is uninsured. History from patient is unreliable. History  Alcohol Use: Not on file    History  Drug Use No     Allergies: Not on File        Current Medications: Current Facility-Administered Medications  Medication Dose Route Frequency Provider Last Rate Last Dose  . acetaminophen (TYLENOL) tablet 650 mg  650 mg Oral Q6H PRN Audery Amel, MD   650 mg at 07/15/15 1308  . alum & mag hydroxide-simeth (MAALOX/MYLANTA) 200-200-20 MG/5ML suspension 30 mL  30 mL Oral Q4H PRN Audery Amel, MD      . famotidine (PEPCID) tablet 20 mg  20 mg Oral Daily Jimmy Footman, MD   20 mg at 07/15/15 1029  . LORazepam (ATIVAN) tablet 2 mg  2 mg Oral TID PRN Jimmy Footman, MD      . magnesium hydroxide (MILK OF MAGNESIA) suspension 30 mL  30 mL Oral Daily PRN Audery Amel, MD      . OLANZapine (ZYPREXA) injection 15 mg  15 mg Intramuscular QHS PRN Darliss Ridgel, MD      . OLANZapine zydis (ZYPREXA) disintegrating tablet 15 mg  15  mg Oral QHS Jimmy Footman, MD   15 mg at 07/12/15 2109  . traZODone (DESYREL) tablet 100 mg  100 mg Oral QHS PRN Jimmy Footman, MD      . white petrolatum (VASELINE) gel   Topical PRN Audery Amel, MD        Lab Results:  No results found for this or any previous visit (from the past 48 hour(s)).  Physical Findings: AIMS: Facial and Oral Movements Muscles of Facial  Expression: None, normal Lips and Perioral Area: None, normal Jaw: None, normal Tongue: None, normal,Extremity Movements Upper (arms, wrists, hands, fingers): None, normal Lower (legs, knees, ankles, toes): None, normal, Trunk Movements Neck, shoulders, hips: None, normal, Overall Severity Severity of abnormal movements (highest score from questions above): None, normal Incapacitation due to abnormal movements: None, normal Patient's awareness of abnormal movements (rate only patient's report): No Awareness, Dental Status Current problems with teeth and/or dentures?: No Does patient usually wear dentures?: No      Musculoskeletal: Strength & Muscle Tone: within normal limits Gait & Station: normal Patient leans: N/A  Psychiatric Specialty Exam: Review of Systems  Constitutional: Negative.  Negative for fever, chills, weight loss, malaise/fatigue and diaphoresis.  HENT: Negative.  Negative for congestion, ear discharge, ear pain, hearing loss, nosebleeds, sore throat and tinnitus.   Eyes: Negative.  Negative for blurred vision, double vision, photophobia and pain.  Respiratory: Negative.  Negative for cough, hemoptysis, sputum production, shortness of breath, wheezing and stridor.   Cardiovascular: Negative.  Negative for chest pain, palpitations, orthopnea, claudication, leg swelling and PND.  Gastrointestinal: Negative.  Negative for heartburn, nausea, vomiting, abdominal pain, diarrhea, constipation, blood in stool and melena.  Genitourinary: Negative.  Negative for dysuria, urgency and frequency.  Musculoskeletal: Negative for myalgias, back pain, joint pain and neck pain.  Skin: Negative.  Negative for itching and rash.  Neurological: Negative.  Negative for dizziness, tingling, tremors, sensory change, speech change, focal weakness, weakness and headaches.  Endo/Heme/Allergies: Negative.  Negative for environmental allergies. Does not bruise/bleed easily.    Blood pressure  109/66, pulse 77, temperature 97.9 F (36.6 C), temperature source Oral, resp. rate 20, height 5' 9.5" (1.765 m), weight 82.555 kg (182 lb), last menstrual period 06/10/2015.Body mass index is 26.5 kg/(m^2).  General Appearance: Well Groomed  Patent attorney::  Good  Speech:  Clear and Coherent  Volume:  Normal  Mood:  Euthymic  Affect:  Appropriate  Thought Process:  vague  Orientation:  Full (Time, Place, and Person)  Thought Content:  Hallucinations: None  Suicidal Thoughts:  No  Homicidal Thoughts:  No  Memory:  Immediate;   Fair Recent;   Fair Remote;   Fair  Judgement:  Fair  Insight:  Shallow  Psychomotor Activity:  Normal  Concentration:  Fair  Recall:  [employer] of Knowledge:Fair  Language: Fair  Akathisia:  No  Handed:    AIMS (if indicated):     Assets:  Games developer Social Support  ADL's:  Intact  Cognition: WNL  Sleep:  Number of Hours: 5.5   Treatment Plan Summary:   20 year old African-American female with diagnosis of schizophrenia. Patient has been admitted due to bizarre behavior secondary to noncompliance with antipsychotics.  Schizophrenia: continue olanzapine 15 mg po qhs.  Refused olanzapine on Saturday.  Appears much improved today.  Strongly encourage her to start long acting injectable but pt refused saying she is afraid of needles   Insomnia: Continue trazodone 100 mg by mouth daily  at bedtime when necessary  Agitation: Continue Ativan 2 mg by mouth every 8 hours as needed when necessary for agitation  Diet regular  Precautions every 15 minute checks  Hospitalization status continue involuntary commitment  Labs: We'll order hemoglobin A1c, lipid panel, TSH and prolactin.--- These labs are still pending  Plan to d/c home tomorrow.  The information was obtained from the patient's mother today. She reports that the patient was at [facility] in November for 3 weeks then she was restarted on  olanzapine. She was then transferred to [facility] for one week. She was treated there with Risperdal 1 mg by mouth twice a day and however this medication was discontinued due to tremors and she was treated again with olanzapine to 15 mg a day.  After discharge the patient became noncompliant. The patient refused to go to her follow-up appointment at [facility].  Patient has been using cannabis and only drinks occasionally on the weekends.  Mother said that prior to admission the patient was laughing inappropriately and throwing herself on the ground, she was arguing on the phone with videos.  Mother states that the patient has threatened suicide 3 times before and has attempted to strangle her.    Patient graduated high school.  Currently lives with her mother in [place]. Patient's father lives in [place].  Patient has not been in [place] in 7 years.  As far as family history patient has a great aunt who had suffered from schizophrenia.  Patient has been refusing blood work. Will order again today.  Says she is afraid of needless.   Jimmy Footman, MD 07/15/2015, 1:18 PM

## 2015-07-15 NOTE — Progress Notes (Signed)
D:  Patient denies SI/HI/AVH.   Denies depression.   No signs of inappropriate behaviors noted.  Affect blunted.  Visible in milieu.  Complain of headache x1. A:   medicated for headache with good results. Support and encouragement offered.  Safety maintained.  Encouraged group attendance.  Medications administered according to orders.  R:   Compliant with treatment plan.

## 2015-07-15 NOTE — Plan of Care (Signed)
Problem: Alteration in thought process Goal: LTG-Patient behavior demonstrates decreased signs psychosis (Patient behavior demonstrates decreased signs of psychosis to the point the patient is safe to return home and continue treatment in an outpatient setting.)  Outcome: Progressing Decreased psychosis noted.      

## 2015-07-15 NOTE — [facility] Suicide Risk Assessment (Signed)
[facility] Discharge Suicide Risk Assessment   Principal Problem: Schizophrenia [facility]) Discharge Diagnoses:  Patient Active Problem List   Diagnosis Date Noted  . Schizophrenia (HCC) [F20.9] 07/10/2015    Total Time spent with patient: 30 minutes    Psychiatric Specialty Exam: ROS                                                         Mental Status Per Nursing Assessment::   On Admission:     Demographic Factors:  Unemployed  Loss Factors: Decrease in vocational status  Historical Factors: Impulsivity  Risk Reduction Factors:   Sense of responsibility to family, Living with another person, especially a relative and Positive social support  Continued Clinical Symptoms:  Schizophrenia:   Less than 46 years old Previous Psychiatric Diagnoses and Treatments  Cognitive Features That Contribute To Risk:  None    Suicide Risk:  Minimal: No identifiable suicidal ideation.  Patients presenting with no risk factors but with morbid ruminations; may be classified as minimal risk based on the severity of the depressive symptoms  Follow-up Information    Follow up with [facility]  In 1 day.   Why:  Your hospital follow up appointment will be walk in. Walk in hours are Monday and Thursday between 8:00am- 5:00pm, Tuesday and Wednesday 8:00am- 3:00pm, and Friday 8:00am-1:00pm.    Contact information:   [address] [place], [place]  Phone: [phone] Fax: [phone]       Jimmy Footman, MD 07/16/2015, 8:56 AM

## 2015-07-15 NOTE — Discharge Summary (Addendum)
Physician Discharge Summary Note  Patient:  Katherine Daugherty is an 20 y.o., female MRN:  [id number] DOB:  [birth year] Patient phone:  [phone] (home)  Patient address:   [address] [place] [address],  Total Time spent with patient: 30 minutes  Date of Admission:  07/10/2015 Date of Discharge: 07/16/15  Reason for Admission:  psychosis  Principal Problem: Schizophrenia [facility]) Discharge Diagnoses: Patient Active Problem List   Diagnosis Date Noted  . Schizophrenia (HCC) [F20.9] 07/10/2015   History of Present Illness: Katherine Daugherty is an 20 y.o. African American female who presents to [facility] on referral from [employer] [place]. Patient's mother, [name] [phone]), petitioned for involuntary commitment due to patient's increasingly erratic behavior. Per mother's report, patient ran out of her prescription of Zyprexa approximately five days earlier and has begun exhibiting the following symptoms: sleeplessness, responding to internal stimuli (talking to herself, laughing continuously, talking to the wall), threatening to kill siblings, and threatening to kill herself. Per report, patient has a diagnosis of Schizophreniform Disorder, PTSD, and Cannabis Use Disorder ([place]ild).  Patient lives with her mother and sibling [place]. 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 Per mother's report, patient began receiving psychiatric services through [facility] about a month ago. Per report, [facility] prescribed Zyprexa (5 mg am, 10 mg pm). Mother stated that immediately prior to commitment, patient refused to attend a scheduled appointment with [facility]. Mother contacted [facility] and was advised that patient should have had enough medication if taken as prescribed.  Patient is an unreliable historian. She was only partially cooperative during assessment.  Patient initially denied being diagnosed with schizophrenia and denied being on any  medications prior to admission. 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 She says she just came to [place] a few days ago to visit her mother.  Later on the patient stated that her mother put her in the hospital because she has not been taking medications. The patient says that Dr. at [facility] is started her on Risperdal at this medication did not work she was then transitioned to Zyprexa.  Patient says she is unaware of the reasons why she was prescribed these medications. Today she denies problems with her mood, appetite energy or concentration. She denies suicidality, homicidality or having auditory or visual hallucinations. She denies having any persecutory delusions.  Social worker contacted the patient's mother for collateral information. The patient's mother reported patient is not enrolled in college at this time. Patient was diagnosed with schizophrenia couple months ago and has not been compliant with medications. Patient lives with her mother.  Substance abuse history: Patient denies the use of illicit substances or alcohol. She denies the use of nicotine   Associated Signs/Symptoms: Depression Symptoms: denies (Hypo) Manic Symptoms: denies Anxiety Symptoms: denies Psychotic Symptoms: denies PTSD Symptoms: NA   Total Time spent with patient: 1 hour  Past Psychiatric History: At this time I don't have much of her past psychiatric history. Looks for the patient was diagnosed recently with schizophrenia and has been follow-up at [facility] however she has become noncompliant lately.  Past Medical History: Denies any history of chronic medical conditions. Seizures or head trauma Past Medical History  Diagnosis Date  . Asthma    History reviewed. No pertinent past surgical history.  Family History: History reviewed. No pertinent family history.  Family Psychiatric History: Patient denies any family history of mental illness, suicide or substance abuse  Social History:  Not much of her social history is known at this time. She lives with her mother. Patient says she is uninsured. History  Alcohol Use: Not on file    History  Drug Use No     Allergies: Not on File        Hospital Course:    20 year old African-American female with diagnosis of schizophrenia. Patient has been admitted due to bizarre behavior secondary to noncompliance with antipsychotics.  Schizophrenia: She continues to have some bizarre delusions but has been compliant with medications. She refused Zyprexa last night. We'll continue Zyprexa 15 mg by mouth nightly and will give Zyprexa  IM if she refuses po meds. Patient is states that she was taking olanzapine 15 mg prior to admission. She had stopped taking them Zyprexa because she ran out. The patient should be on an injectable medication due to her lack of insight and poor compliance.  Insomnia: Continue trazodone 100 mg by mouth daily at bedtime when necessary  Agitation: Continue Ativan 2 mg by mouth every 8 hours as needed when necessary for agitation  Diet regular  Precautions every 15 minute checks  Hospitalization status continue involuntary commitment  Collateral information will be obtained from the patient's mother  Labs: We'll order hemoglobin A1c, lipid panel, TSH and prolactin.--- These labs are still pending  The information was obtained from the patient's mother today. She reports that the patient was at [facility] in November for 3 weeks then she was restarted on olanzapine. She was then transferred to [facility] for one week. She was treated there with Risperdal 1 mg by mouth twice a day and however this medication was discontinued due to tremors and she was treated again with olanzapine to 15 mg a day.  After discharge the patient became noncompliant. The patient refused to go to her follow-up appointment at [facility].  Patient has been using cannabis and only drinks  occasionally on the weekends.  Mother said that prior to admission the patient was laughing inappropriately and throwing herself on the ground, she was arguing on the phone with videos. Mother states that the patient has threatened suicide 3 times before and has attempted to strangle her.   Patient graduated high school. Currently lives with her mother in [place]. Patient's father lives in [place]. Patient has not been in [place] in 7 years.  As far as family history patient has a great aunt who had suffered from schizophrenia.  Patient has been refusing blood work.  We were not able to obtain lipid panel, hemoglobin A1c, prolactin level or TSH. Patient did not even agree for a CBC or basic metabolic panel. She says she is afraid of needles.  This hospitalization was uneventful. During her stay the patient did not display any disruptive behaviors or unsafe behaviors. She initially was withdrawn to her mom not participating in any group activity. She agreed with treatment with olanzapine and after a few doses of the medication the patient started to improve. She was seen interacting with peers in the day room. She was seen in groups. She was more interactive with staff.  At times patient denied having depressed mood, problems with his sleep, appetite, energy or concentration. She denied suicidality, homicidality or having auditory or visual hallucinations.  During the first few days of her hospitalization the staff did not is patient was laughing inappropriately. In addition to voicing delusions about being a Careers information officer.  On the day of the discharge the patient was  calm, pleasant and cooperative. She denied having any issues are concerned. Her mood was euthymic and her affect was bright and reactive.  Throughout her hospitalization she comply with medications. There was no need for seclusion, restraints or forced medications..  Patient was a strongly encouraged to restart  treatment with a long-acting injectable but she declined.   Patient will return home with her mother today and we'll follow up with [facility] in [place].  Physical Findings: AIMS: Facial and Oral Movements Muscles of Facial Expression: None, normal Lips and Perioral Area: None, normal Jaw: None, normal Tongue: None, normal,Extremity Movements Upper (arms, wrists, hands, fingers): None, normal Lower (legs, knees, ankles, toes): None, normal, Trunk Movements Neck, shoulders, hips: None, normal, Overall Severity Severity of abnormal movements (highest score from questions above): None, normal Incapacitation due to abnormal movements: None, normal Patient's awareness of abnormal movements (rate only patient's report): No Awareness, Dental Status Current problems with teeth and/or dentures?: No Does patient usually wear dentures?: No  CIWA:    COWS:     Musculoskeletal: Strength & Muscle Tone: within normal limits Gait & Station: normal Patient leans: N/A  Psychiatric Specialty Exam: Review of Systems  Constitutional: Negative.   HENT: Negative.   Eyes: Negative.   Respiratory: Negative.   Cardiovascular: Negative.   Gastrointestinal: Negative.   Genitourinary: Negative.   Musculoskeletal: Negative.   Skin: Negative.   Neurological: Negative.   Endo/Heme/Allergies: Negative.   Psychiatric/Behavioral: Negative.     Blood pressure 109/66, pulse 77, temperature 97.9 F (36.6 C), temperature source Oral, resp. rate 20, height 5' 9.5" (1.765 m), weight 82.555 kg (182 lb), last menstrual period 06/10/2015.Body mass index is 26.5 kg/(m^2).  General Appearance: Well Groomed  Patent attorney::  Good  Speech:  Clear and Coherent  Volume:  Normal  Mood:  Euthymic  Affect:  Appropriate  Thought Process:  Linear  Orientation:  Full (Time, Place, and Person)  Thought Content:  Hallucinations: None  Suicidal Thoughts:  No  Homicidal Thoughts:  No  Memory:  Immediate;   Good Recent;    Good Remote;   Good  Judgement:  Fair  Insight:  Fair  Psychomotor Activity:  Normal  Concentration:  Good  Recall:  Good  Fund of Knowledge:Good  Language: Good  Akathisia:  No  Handed:    AIMS (if indicated):     Assets:  Architect Housing Physical Health Social Support  ADL's:  Intact  Cognition: WNL  Sleep:  Number of Hours: 5.5   Have you used any form of tobacco in the last 30 days? (Cigarettes, Smokeless Tobacco, Cigars, and/or Pipes): No  Has this patient used any form of tobacco in the last 30 days? (Cigarettes, Smokeless Tobacco, Cigars, and/or Pipes) Yes, No  Metabolic Disorder Labs: PATIENT REFUSED BLOOD WORK No results found for: HGBA1C, MPG No results found for: PROLACTIN No results found for: CHOL, TRIG, HDL, CHOLHDL, VLDL, LDLCALC    Results for SENNIE, BORDEN (MRN [id number]) as of 07/16/2015 14:09  Ref. Range 07/11/2015 18:22  Preg Test, Ur Latest Ref Range: NEGATIVE  NEGATIVE  Appearance Latest Ref Range: CLEAR  CLEAR (A)  Bacteria, UA Latest Ref Range: NONE SEEN  RARE (A)  Bilirubin Urine Latest Ref Range: NEGATIVE  NEGATIVE  Color, Urine Latest Ref Range: YELLOW  STRAW (A)  Glucose Latest Ref Range: NEGATIVE mg/dL NEGATIVE  Hgb urine dipstick Latest Ref Range: NEGATIVE  NEGATIVE  Ketones, ur Latest Ref Range: NEGATIVE mg/dL NEGATIVE  Leukocytes, UA Latest Ref  Range: NEGATIVE  NEGATIVE  Nitrite Latest Ref Range: NEGATIVE  NEGATIVE  pH Latest Ref Range: 5.0-8.0  6.0  Protein Latest Ref Range: NEGATIVE mg/dL NEGATIVE  RBC / HPF Latest Ref Range: 0-5 RBC/hpf 0-5  Specific Gravity, Urine Latest Ref Range: 1.005-1.030  1.004 (L)  Squamous Epithelial / LPF Latest Ref Range: NONE SEEN  0-5 (A)  WBC, UA Latest Ref Range: 0-5 WBC/hpf NONE SEEN  Amphetamines, Ur Screen Latest Ref Range: NONE DETECTED  NONE DETECTED  Barbiturates, Ur Screen Latest Ref Range: NONE DETECTED  NONE DETECTED  Benzodiazepine, Ur Scrn Latest  Ref Range: NONE DETECTED  NONE DETECTED  Cocaine Metabolite,Ur Constantine Latest Ref Range: NONE DETECTED  NONE DETECTED  Methadone Scn, Ur Latest Ref Range: NONE DETECTED  NONE DETECTED  MDMA (Ecstasy)Ur Screen Latest Ref Range: NONE DETECTED  NONE DETECTED  Cannabinoid 50 Ng, Ur  Latest Ref Range: NONE DETECTED  NONE DETECTED  Opiate, Ur Screen Latest Ref Range: NONE DETECTED  NONE DETECTED  Phencyclidine (PCP) Ur S Latest Ref Range: NONE DETECTED  NONE DETECTED  Tricyclic, Ur Screen Latest Ref Range: NONE DETECTED  NONE DETECTED    See Psychiatric Specialty Exam and Suicide Risk Assessment completed by Attending Physician prior to discharge.  Discharge destination:  Home  Is patient on multiple antipsychotic therapies at discharge:  No   Has Patient had three or more failed trials of antipsychotic monotherapy by history:  No  Recommended Plan for Multiple Antipsychotic Therapies: NA       Medication List    TAKE these medications      Indication   famotidine 20 MG tablet  Commonly known as:  PEPCID  Take 1 tablet (20 mg total) by mouth daily.  Notes to Patient:  GERD      OLANZapine 15 MG tablet  Commonly known as:  ZYPREXA  Take 1 tablet (15 mg total) by mouth at bedtime.  Notes to Patient:  psychosis      traZODone 100 MG tablet  Commonly known as:  DESYREL  Take 1 tablet (100 mg total) by mouth at bedtime as needed for sleep.  Notes to Patient:  insomnia           Signed: Jimmy Footman, MD 07/15/2015, 2:33 PM

## 2015-07-15 NOTE — Progress Notes (Signed)
Recreation Therapy Notes  Date: 02.06.17 Time: 3:00 pm Location: Craft Room  Group Topic: Wellness  Goal Area(s) Addresses:  Patient will identify at least one item per dimension of health. Patient will examine areas they are deficient in.  Behavioral Response: Did not attend  Intervention: 6 Dimensions of Health  Activity: Patients were given a sheets with the definitions of the 6 Dimensions of Health on it and a worksheet with the 6 Dimensions written out. Patients were instructed to write 2-3 things they are currently doing in each category.  Education: LRT educated patients on ways they can increase areas they are deficient in.  Education Outcome: Patient did not attend group.  Clinical Observations/Feedback: Patient did not attend group.  Jacquelynn Cree, LRT/CTRS 07/15/2015 4:15 PM

## 2015-07-16 NOTE — [facility] Group Notes (Signed)
[facility] Group Notes:  (Nursing/MHT/Case Management/Adjunct)  Date:  07/16/2015  Time:  3:57 AM  Type of Therapy:  Group Therapy  Participation Level:  Active  Participation Quality:  Appropriate  Affect:  Appropriate  Cognitive:  Appropriate  Insight:  Improving  Engagement in Group:  Developing/Improving and Engaged  Modes of Intervention:  n/a  Summary of Progress/Problems:  Katherine Daugherty 07/16/2015, 3:57 AM

## 2015-07-16 NOTE — Progress Notes (Signed)
Pleasant and cooperative.  Denies SI/Hi/AVH.   Denies depression.  Medication and group compliant.  No inappropriate behavior noted.   Discharge instruction given.  Verbalized understanding.  Prescriptions given and personal belongings returned.  Escorted off unit by this Clinical research associate to meet PD to be transported home.

## 2015-07-16 NOTE — Progress Notes (Signed)
  [facility] Adult Case Management Discharge Plan :  Will you be returning to the same living situation after discharge:  Yes,    At discharge, do you have transportation home?: Yes,   [facility] transport Do you have the ability to pay for your medications: Yes,  with help of DSS pharmacy.  Release of information consent forms completed and in the chart;  Patient's signature needed at discharge.  Patient to Follow up at: Follow-up Information    Follow up with [facility]  In 1 day.   Why:  Your hospital follow up appointment will be walk in. Walk in hours are Monday and Thursday between 8:00am- 5:00pm, Tuesday and Wednesday 8:00am- 3:00pm, and Friday 8:00am-1:00pm.    Contact information:   [address] [place], [place]  Phone: [phone] Fax: [phone]      Next level of care provider has access to [facility] Link:no  Safety Planning and Suicide Prevention discussed: Yes,     Have you used any form of tobacco in the last 30 days? (Cigarettes, Smokeless Tobacco, Cigars, and/or Pipes): No  Has patient been referred to the Quitline?: N/A patient is not a smoker  Patient has been referred for addiction treatment: N/A  Murle Hellstrom, Cleda Daub, MSW, LCSW 07/16/2015, 4:32 PM

## 2015-07-16 NOTE — Tx Team (Signed)
Interdisciplinary Treatment Plan Update (Adult)  Date:  07/16/2015 Time Reviewed:  4:34 PM  Progress in Treatment: Attending groups: No. Participating in groups:  No. Taking medication as prescribed:  Yes. Tolerating medication:  Yes. Family/Significant othe contact made:  Yes, individual(s) contacted:  Mother Gae Dry [phone] Patient understands diagnosis:  No. and As evidenced by:  lack of insight Discussing patient identified problems/goals with staff:  No. Medical problems stabilized or resolved:  Yes. Denies suicidal/homicidal ideation: Yes. Issues/concerns per patient self-inventory:  No. Other:  New problem(s) identified: No, Describe:     Discharge Plan or Barriers: Discharge home to mother in [place].  Pt will follow up with [facility] in [place]  Reason for Continuation of Hospitalization: Delusions  Hallucinations  Comments:n Pt discharging today, Stable enough for discharge, acute symptoms subsided and mother plans to take her to [facility] today to see physician and continue medications through [facility] assistance.  Estimated length of stay:0 days  New goal(s):  Review of initial/current patient goals per problem list:   Review of initial/current patient goals per problem list:  1. Goal(s): Patient will participate in aftercare plan  Met:07/16/2015  Target date: at discharge  As evidenced by: Patient will participate within aftercare plan AEB aftercare provider and housing plan at discharge being identified.  2. Goal (s): Patient will demonstrate decreased symptoms of psychosis.  Met:yes-07/16/2015  Target date: at discharge  As evidenced by: Patient will not endorse signs of psychosis or be deemed stable for discharge by MD. Attendees: Patient:  Katherine Daugherty 2/7/20174:34 PM  Family:   2/7/20174:34 PM  Physician:  Merlyn Albert 2/7/20174:34 PM  Nursing:   Elige Radon, RN 2/7/20174:34 PM  Case Manager:   2/7/20174:34 PM   Counselor:  Dossie Arbour, LCSW 2/7/20174:34 PM  Other:  Everitt Amber LRT 2/7/20174:34 PM  Other:   2/7/20174:34 PM  Other:   2/7/20174:34 PM  Other:  2/7/20174:34 PM  Other:  2/7/20174:34 PM  Other:  2/7/20174:34 PM  Other:  2/7/20174:34 PM  Other:  2/7/20174:34 PM  Other:  2/7/20174:34 PM  Other:   2/7/20174:34 PM   Scribe for Treatment Team:   August Saucer, 07/16/2015, 4:34 PM, MSW, LCSW

## 2015-07-16 NOTE — Progress Notes (Signed)
D: Pt seen in the milieu this evening interacting with peers. Denies SI/HI/AVH at this time. Pt c/o back pain rated a 10 out of 10 which she requests PRN medication for. Pt reports that her goal for today was to "focus on going home." Pt reports that she will be staying at her mom's house after discharge. A: Emotional support and encouragement provided. Medications administered as prescribed. q15 minute safety checks maintained. R: Pt remains free from harm.

## 2015-07-16 NOTE — [facility] Group Notes (Signed)
[facility] Group Notes:  (Nursing/MHT/Case Management/Adjunct)  Date:  07/16/2015  Time:  2:04 PM  Type of Therapy:  Psychoeducational Skills  Participation Level:  Did Not Attend  Katherine Daugherty Katherine Daugherty 07/16/2015, 2:04 PM 

## 2015-07-16 NOTE — Plan of Care (Signed)
Problem: Alteration in thought process Goal: LTG-Patient behavior demonstrates decreased signs psychosis (Patient behavior demonstrates decreased signs of psychosis to the point the patient is safe to return home and continue treatment in an outpatient setting.)  Outcome: Progressing Pt does not appear to be responding to internal stimuli. Denies AVH at this time

## 2016-11-04 IMAGING — CT CT HEAD W/O CM
1 series · 16 of 30 positions shown, 20 images · non-contrast
Comparison: None.

CLINICAL DATA: Psychosis. Patient?s increasingly erratic
behavior. Per mother?s report, patient ran out of her
prescription of Zyprexa approximately five days earlier and has
begun exhibiting the following symptoms: sleeplessness,
inappropriate behavior.

EXAM:
CT HEAD WITHOUT CONTRAST
TECHNIQUE: Contiguous axial images were obtained from the base of the skull
through the vertex without intravenous contrast.

[Series 2: head wo · axial · 0.41mm/px · z∈[+164,+290]mm · 16 of 32 slices shown, 20 images]
[im 2/32  brain]
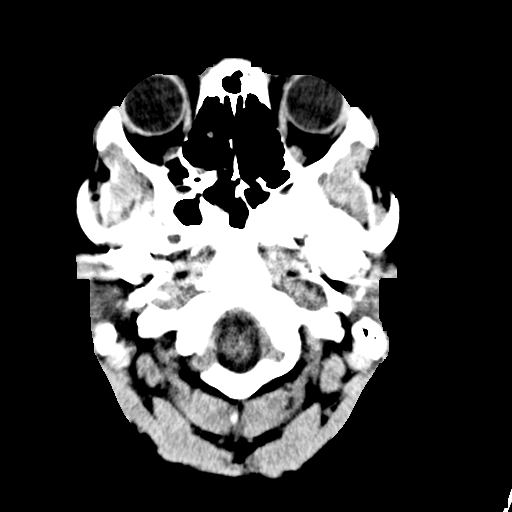
[im 2/32  bone]
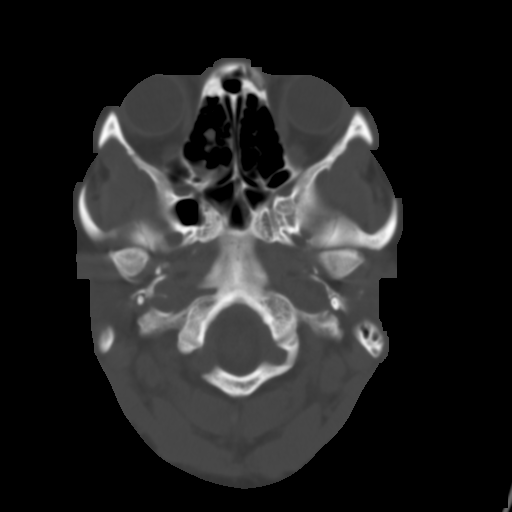
[im 4/32  brain]
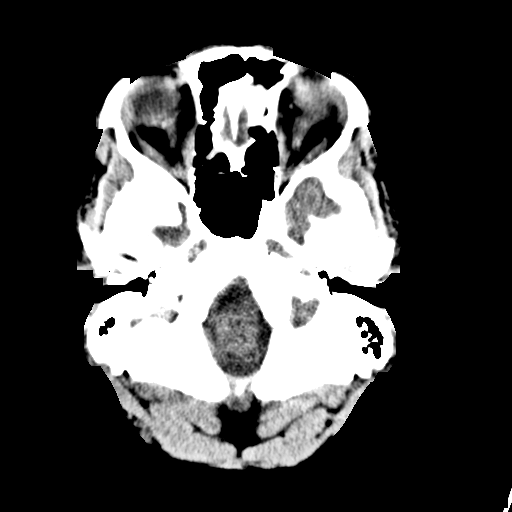
[im 6/32  brain]
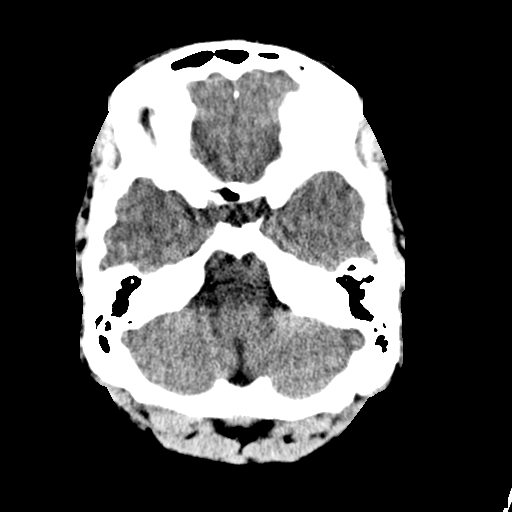
[im 8/32  brain]
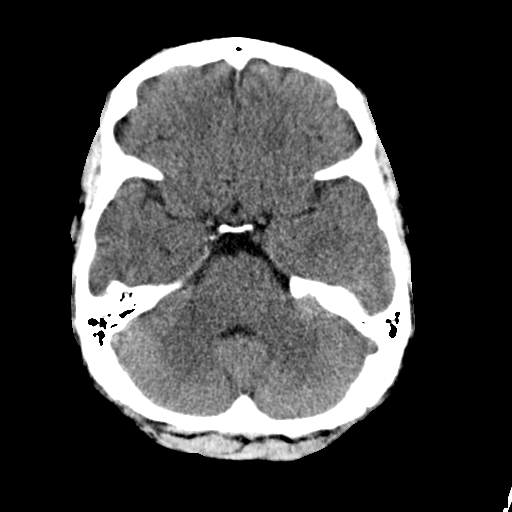
[im 9/32  brain]
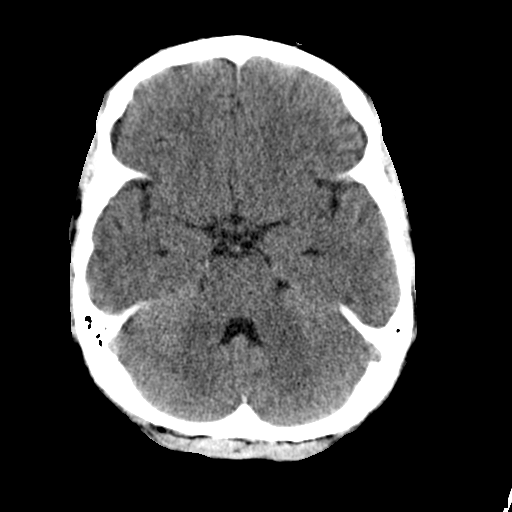
[im 9/32  bone]
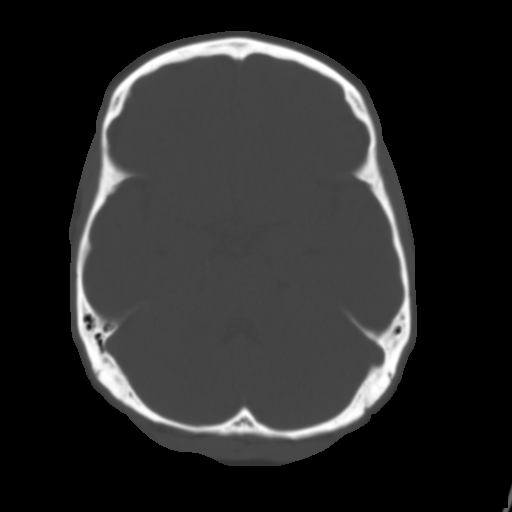
[im 11/32  brain]
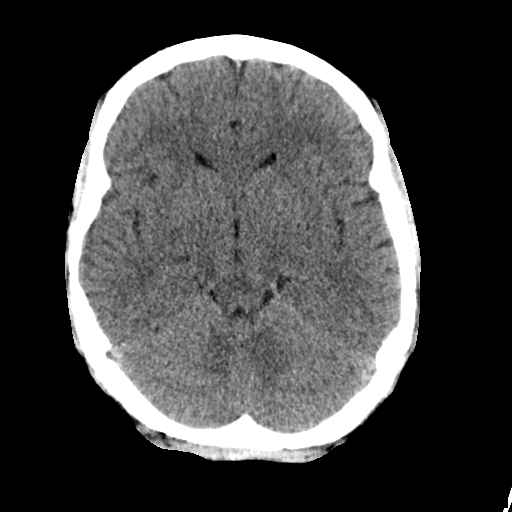
[im 13/32  brain]
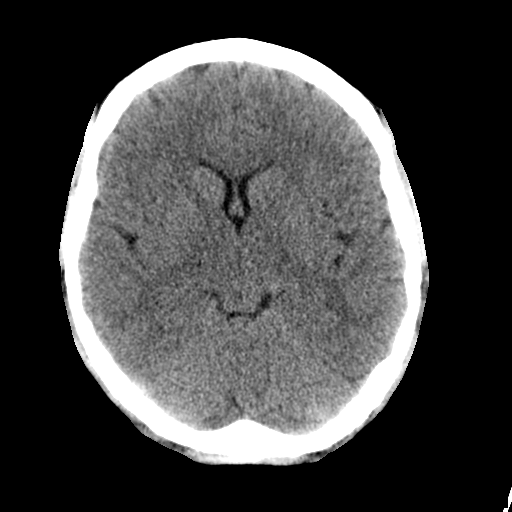
[im 15/32  brain]
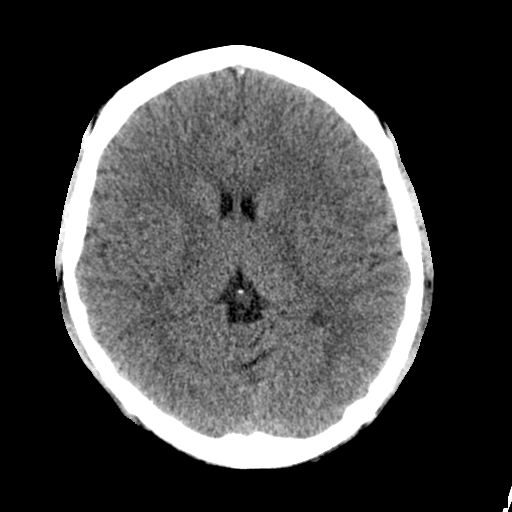
[im 17/32  brain]
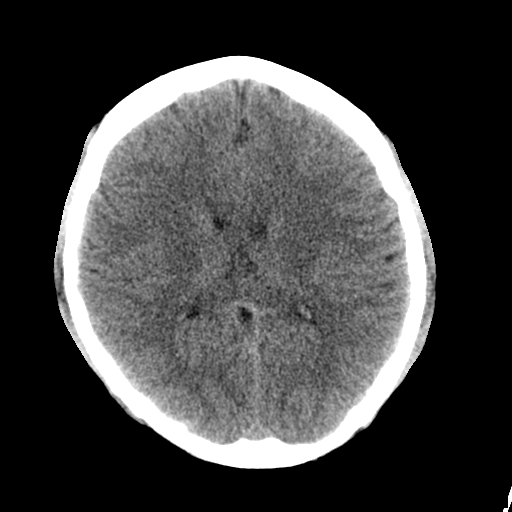
[im 17/32  bone]
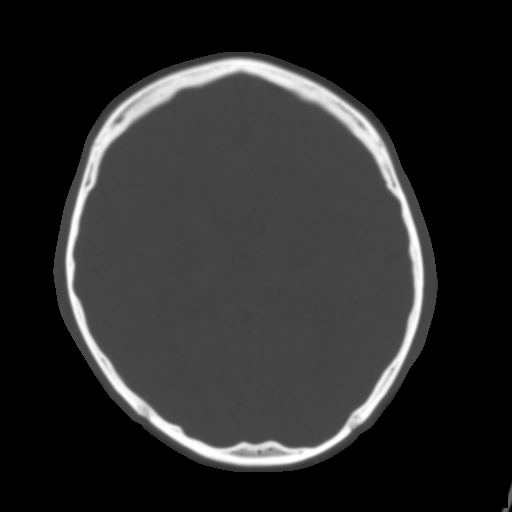
[im 19/32  brain]
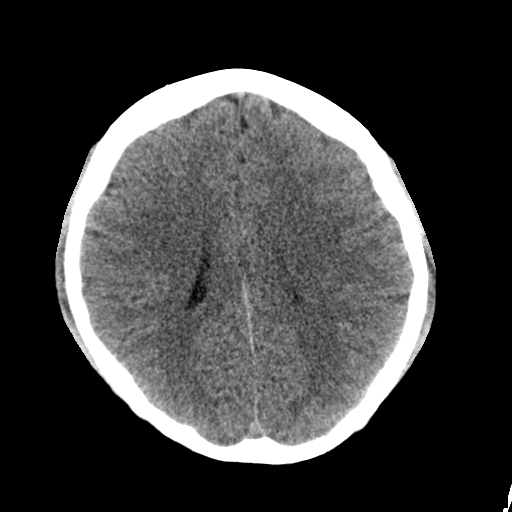
[im 21/32  brain]
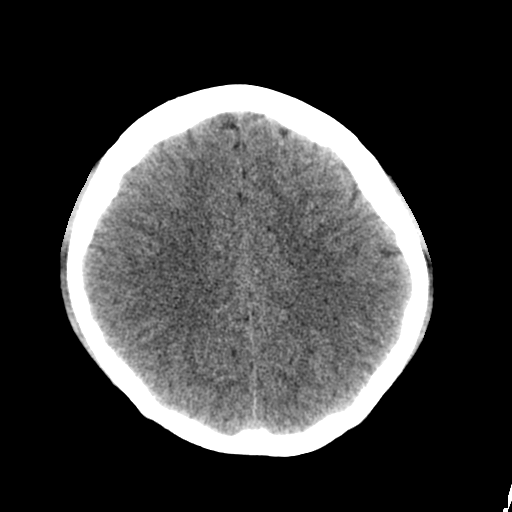
[im 23/32  brain]
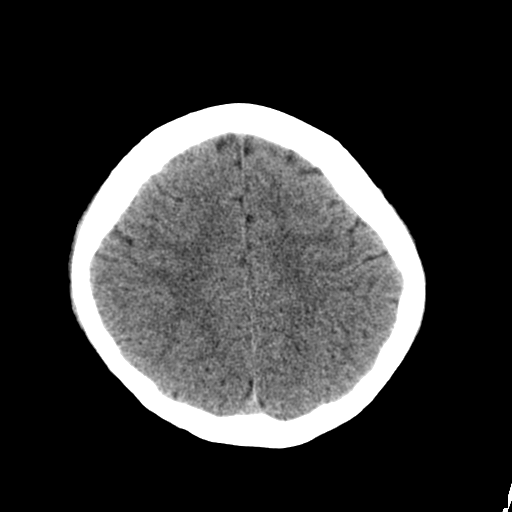
[im 24/32  brain]
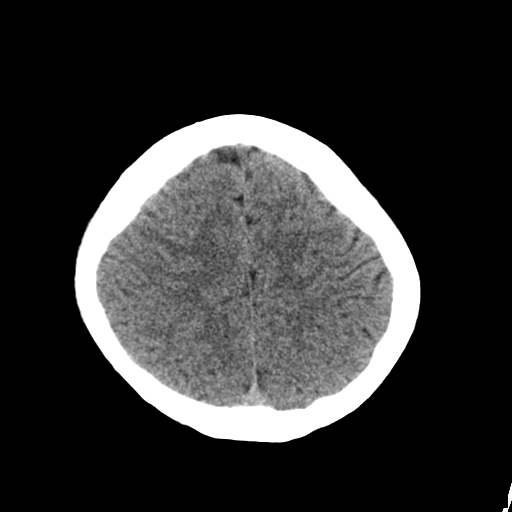
[im 24/32  bone]
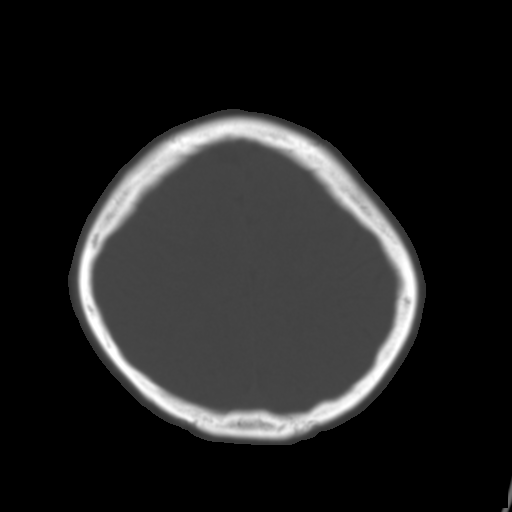
[im 26/32  brain]
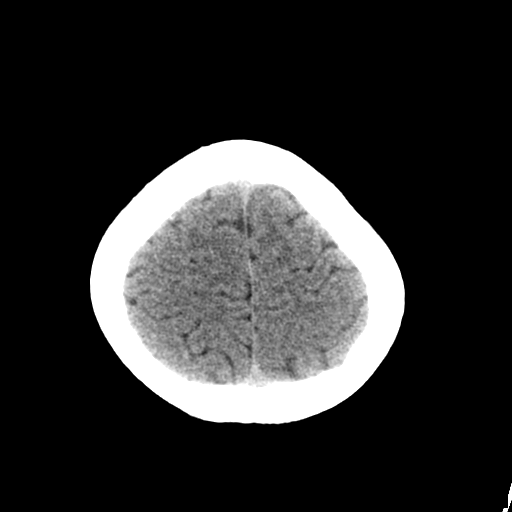
[im 28/32  brain]
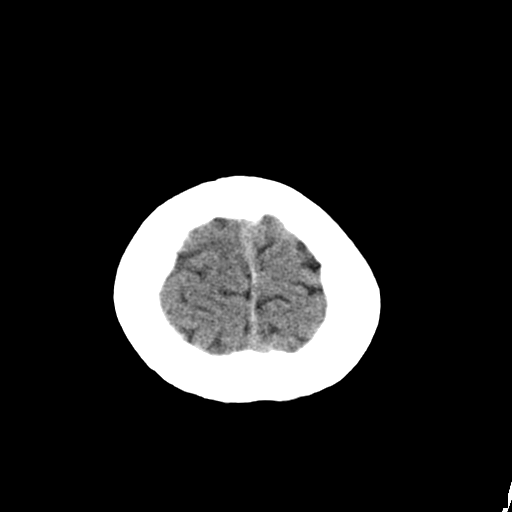
[im 30/32  brain]
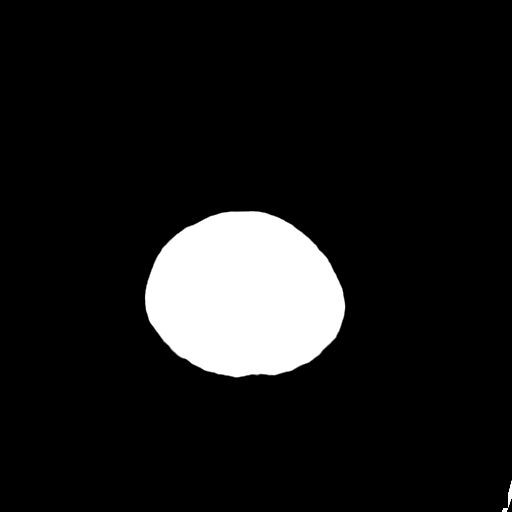

[16 of 30 positions shown; findings below may reference images not displayed]

FINDINGS: No acute intracranial hemorrhage. No focal mass lesion. No CT
evidence of acute infarction. No midline shift or mass effect. No
hydrocephalus. Basilar cisterns are patent.

Paranasal sinuses and  mastoid air cells are clear.
IMPRESSION: Normal head CT
